# Patient Record
Sex: Female | Born: 1945
Health system: Southern US, Community
[De-identification: ages and names within clinical notes are randomized; demographics above are authoritative.]

## PROBLEM LIST (undated history)

## (undated) DIAGNOSIS — M1990 Unspecified osteoarthritis, unspecified site: Secondary | ICD-10-CM

## (undated) DIAGNOSIS — I10 Essential (primary) hypertension: Secondary | ICD-10-CM

## (undated) DIAGNOSIS — R011 Cardiac murmur, unspecified: Secondary | ICD-10-CM

## (undated) DIAGNOSIS — E119 Type 2 diabetes mellitus without complications: Secondary | ICD-10-CM

## (undated) DIAGNOSIS — E1142 Type 2 diabetes mellitus with diabetic polyneuropathy: Secondary | ICD-10-CM

## (undated) HISTORY — PX: HERNIA REPAIR: SHX51

## (undated) HISTORY — PX: THORACOTOMY: SUR1349

## (undated) HISTORY — PX: ABDOMINAL HYSTERECTOMY: SHX81

## (undated) HISTORY — PX: CHOLECYSTECTOMY: SHX55

---

## 2004-09-25 ENCOUNTER — Ambulatory Visit: Payer: Self-pay | Admitting: Family Medicine

## 2005-07-18 ENCOUNTER — Ambulatory Visit: Payer: Self-pay | Admitting: Cardiology

## 2005-09-10 ENCOUNTER — Ambulatory Visit: Payer: Self-pay | Admitting: Unknown Physician Specialty

## 2005-10-03 ENCOUNTER — Ambulatory Visit: Payer: Self-pay | Admitting: Family Medicine

## 2006-11-02 ENCOUNTER — Emergency Department: Payer: Self-pay

## 2009-09-28 ENCOUNTER — Ambulatory Visit: Payer: Self-pay | Admitting: Unknown Physician Specialty

## 2011-03-01 ENCOUNTER — Ambulatory Visit: Payer: Self-pay | Admitting: Unknown Physician Specialty

## 2015-04-27 ENCOUNTER — Other Ambulatory Visit: Payer: Self-pay | Admitting: Podiatry

## 2015-04-27 DIAGNOSIS — M25572 Pain in left ankle and joints of left foot: Secondary | ICD-10-CM

## 2015-05-02 ENCOUNTER — Ambulatory Visit
Admission: RE | Admit: 2015-05-02 | Discharge: 2015-05-02 | Disposition: A | Discharge status: Home / Self Care | Payer: Medicare Other | Source: Ambulatory Visit | Attending: Podiatry | Admitting: Podiatry

## 2015-05-02 DIAGNOSIS — D2122 Benign neoplasm of connective and other soft tissue of left lower limb, including hip: Secondary | ICD-10-CM | POA: Diagnosis not present

## 2015-05-02 DIAGNOSIS — M25572 Pain in left ankle and joints of left foot: Secondary | ICD-10-CM

## 2015-05-02 MED ORDER — GADOBENATE DIMEGLUMINE 529 MG/ML IV SOLN
20.0000 mL | Freq: Once | INTRAVENOUS | Status: AC | PRN
  Administered 2015-05-02: 19 mL via INTRAVENOUS

## 2015-05-17 ENCOUNTER — Encounter
Admission: RE | Admit: 2015-05-17 | Discharge: 2015-05-17 | Disposition: A | Discharge status: Home / Self Care | Payer: Medicare Other | Source: Ambulatory Visit | Attending: Podiatry | Admitting: Podiatry

## 2015-05-17 DIAGNOSIS — I10 Essential (primary) hypertension: Secondary | ICD-10-CM | POA: Diagnosis not present

## 2015-05-17 DIAGNOSIS — D2122 Benign neoplasm of connective and other soft tissue of left lower limb, including hip: Secondary | ICD-10-CM | POA: Diagnosis present

## 2015-05-17 DIAGNOSIS — E119 Type 2 diabetes mellitus without complications: Secondary | ICD-10-CM | POA: Diagnosis not present

## 2015-05-17 HISTORY — DX: Type 2 diabetes mellitus without complications: E11.9

## 2015-05-17 HISTORY — DX: Essential (primary) hypertension: I10

## 2015-05-17 LAB — BASIC METABOLIC PANEL
Anion gap: 7 (ref 5–15)
BUN: 20 mg/dL (ref 6–20)
CALCIUM: 9.3 mg/dL (ref 8.9–10.3)
CO2: 26 mmol/L (ref 22–32)
CREATININE: 0.72 mg/dL (ref 0.44–1.00)
Chloride: 108 mmol/L (ref 101–111)
GFR calc Af Amer: >60 mL/min (ref >60)
GLUCOSE: 148 mg/dL — AB (ref 65–99)
Potassium: 4.2 mmol/L (ref 3.5–5.1)
Sodium: 141 mmol/L (ref 135–145)

## 2015-05-17 LAB — CBC
HEMATOCRIT: 40 % (ref 35.0–47.0)
Hemoglobin: 12.9 g/dL (ref 12.0–16.0)
MCH: 27.3 pg (ref 26.0–34.0)
MCHC: 32.1 g/dL (ref 32.0–36.0)
MCV: 84.9 fL (ref 80.0–100.0)
PLATELETS: 308 10*3/uL (ref 150–440)
RBC: 4.72 MIL/uL (ref 3.80–5.20)
RDW: 15.4 % — AB (ref 11.5–14.5)
WBC: 12.8 10*3/uL — AB (ref 3.6–11.0)

## 2015-05-17 LAB — DIFFERENTIAL
BASOS PCT: 1 %
Basophils Absolute: 0.1 10*3/uL (ref 0–0.1)
EOS ABS: 0.1 10*3/uL (ref 0–0.7)
Eosinophils Relative: 1 %
Lymphocytes Relative: 18 %
Lymphs Abs: 2.3 10*3/uL (ref 1.0–3.6)
MONO ABS: 0.7 10*3/uL (ref 0.2–0.9)
MONOS PCT: 6 %
NEUTROS ABS: 9.5 10*3/uL — AB (ref 1.4–6.5)
Neutrophils Relative %: 74 %

## 2015-05-17 NOTE — Patient Instructions (Addendum)
  Your procedure is scheduled on: May 20, 2015 (Friday) Report to Day Surgery.[facility]) Second Floor To find out your arrival time please call [phone] between 1PM - 3PM on May 19, 2015 (Thursday).  Remember: Instructions that are not followed completely may result in serious medical risk, up to and including death, or upon the discretion of your surgeon and anesthesiologist your surgery may need to be rescheduled.    __x__ 1. Do not eat food or drink liquids after midnight. No gum chewing or hard candies.     ____ 2. No Alcohol for 24 hours before or after surgery.   ____ 3. Bring all medications with you on the day of surgery if instructed.    __x__ 4. Notify your doctor if there is any change in your medical condition     (cold, fever, infections).     Do not wear jewelry, make-up, hairpins, clips or nail polish.  Do not wear lotions, powders, or perfumes. You may wear deodorant.  Do not shave 48 hours prior to surgery. Men may shave face and neck.  Do not bring valuables to the hospital.    [facility] is not responsible for any belongings or valuables.               Contacts, dentures or bridgework may not be worn into surgery.  Leave your suitcase in the car. After surgery it may be brought to your room.  For patients admitted to the hospital, discharge time is determined by your                treatment team.   Patients discharged the day of surgery will not be allowed to drive home.   Please read over the following fact sheets that you were given:   Surgical Site Infection Prevention   __x__ Take these medicines the morning of surgery with A SIP OF WATER:    1. Metoprolol  2. Amlodipine-Benazepril     ____ Fleet Enema (as directed)   __x__ Use CHG Soap as directed  ____ Use inhalers on the day of surgery  __x__ Stop metformin 2 days prior to surgery (STOP METFORMIN ON December 7 )    ____ Take 1/2 of usual insulin dose the night before surgery  and none on the morning of surgery.   __x__ Stop Coumadin/Plavix/aspirin on (STOP ASPIRIN NOW)  _x_ Stop Anti-inflammatories on (TYLENOL OK TO TAKE FOR PAIN IF NEEDED)   ____ Stop supplements until after surgery.    ____ Bring C-Pap to the hospital.

## 2015-05-20 ENCOUNTER — Encounter: Payer: Self-pay | Admitting: *Deleted

## 2015-05-20 ENCOUNTER — Ambulatory Visit: Payer: Medicare Other | Admitting: Anesthesiology

## 2015-05-20 ENCOUNTER — Encounter
Admission: RE | Disposition: A | Discharge status: Home / Self Care | Payer: Self-pay | Source: Ambulatory Visit | Attending: Podiatry

## 2015-05-20 ENCOUNTER — Ambulatory Visit
Admission: RE | Admit: 2015-05-20 | Discharge: 2015-05-20 | Disposition: A | Discharge status: Home / Self Care | Payer: Medicare Other | Source: Ambulatory Visit | Attending: Podiatry | Admitting: Podiatry

## 2015-05-20 DIAGNOSIS — E119 Type 2 diabetes mellitus without complications: Secondary | ICD-10-CM | POA: Insufficient documentation

## 2015-05-20 DIAGNOSIS — D2122 Benign neoplasm of connective and other soft tissue of left lower limb, including hip: Secondary | ICD-10-CM | POA: Insufficient documentation

## 2015-05-20 DIAGNOSIS — I10 Essential (primary) hypertension: Secondary | ICD-10-CM | POA: Insufficient documentation

## 2015-05-20 HISTORY — PX: MASS EXCISION: SHX2000

## 2015-05-20 LAB — GLUCOSE, CAPILLARY
GLUCOSE-CAPILLARY: 158 mg/dL — AB (ref 65–99)
Glucose-Capillary: 177 mg/dL — ABNORMAL HIGH (ref 65–99)

## 2015-05-20 SURGERY — EXCISION MASS
Anesthesia: General | Laterality: Left

## 2015-05-20 MED ORDER — PROPOFOL 10 MG/ML IV BOLUS
INTRAVENOUS | Status: DC | PRN
  Administered 2015-05-20: 30 mg via INTRAVENOUS

## 2015-05-20 MED ORDER — MIDAZOLAM HCL 2 MG/2ML IJ SOLN
INTRAMUSCULAR | Status: DC | PRN
  Administered 2015-05-20: 2 mg via INTRAVENOUS

## 2015-05-20 MED ORDER — FENTANYL CITRATE (PF) 100 MCG/2ML IJ SOLN: 25.0000 ug | INTRAMUSCULAR | Status: DC | PRN

## 2015-05-20 MED ORDER — OXYCODONE-ACETAMINOPHEN 5-325 MG PO TABS: 1.0000 | ORAL_TABLET | ORAL | Status: DC | PRN

## 2015-05-20 MED ORDER — FENTANYL CITRATE (PF) 100 MCG/2ML IJ SOLN
INTRAMUSCULAR | Status: DC | PRN
  Administered 2015-05-20: 25 ug via INTRAVENOUS

## 2015-05-20 MED ORDER — SODIUM CHLORIDE 0.9 % IV SOLN
INTRAVENOUS | Status: DC
  Administered 2015-05-20: 10:00:00 via INTRAVENOUS

## 2015-05-20 MED ORDER — ONDANSETRON HCL 4 MG/2ML IJ SOLN: 4.0000 mg | Freq: Once | INTRAMUSCULAR | Status: DC | PRN

## 2015-05-20 MED ORDER — BUPIVACAINE HCL 0.5 % IJ SOLN
INTRAMUSCULAR | Status: DC | PRN
  Administered 2015-05-20: 17 mL

## 2015-05-20 MED ORDER — BUPIVACAINE HCL (PF) 0.5 % IJ SOLN
INTRAMUSCULAR | Status: AC
  Filled 2015-05-20: qty 30

## 2015-05-20 MED ORDER — ONDANSETRON HCL 4 MG/2ML IJ SOLN
INTRAMUSCULAR | Status: DC | PRN
  Administered 2015-05-20: 4 mg via INTRAVENOUS

## 2015-05-20 MED ORDER — LIDOCAINE HCL (CARDIAC) 20 MG/ML IV SOLN
INTRAVENOUS | Status: DC | PRN
  Administered 2015-05-20: 60 mg via INTRAVENOUS

## 2015-05-20 MED ORDER — FAMOTIDINE 20 MG PO TABS
ORAL_TABLET | ORAL | Status: AC
  Administered 2015-05-20: 20 mg
  Filled 2015-05-20: qty 1

## 2015-05-20 MED ORDER — PROPOFOL 500 MG/50ML IV EMUL
INTRAVENOUS | Status: DC | PRN
  Administered 2015-05-20: 50 ug/kg/min via INTRAVENOUS

## 2015-05-20 SURGICAL SUPPLY — 32 items
BAG COUNTER SPONGE EZ (MISCELLANEOUS) IMPLANT
BANDAGE ELASTIC 4 LF NS (GAUZE/BANDAGES/DRESSINGS) ×3 IMPLANT
BANDAGE STRETCH 3X4.1 STRL (GAUZE/BANDAGES/DRESSINGS) IMPLANT
BNDG ESMARK 4X12 TAN STRL LF (GAUZE/BANDAGES/DRESSINGS) ×3 IMPLANT
BNDG GAUZE 4.5X4.1 6PLY STRL (MISCELLANEOUS) ×3 IMPLANT
CANISTER SUCT 1200ML W/VALVE (MISCELLANEOUS) ×3 IMPLANT
CLOSURE WOUND 1/4X4 (GAUZE/BANDAGES/DRESSINGS) ×1
COUNTER SPONGE BAG EZ (MISCELLANEOUS)
CUFF TOURN 18 STER (MISCELLANEOUS) ×3 IMPLANT
CUFF TOURN DUAL PL 12 NO SLV (MISCELLANEOUS) IMPLANT
DURAPREP 26ML APPLICATOR (WOUND CARE) ×3 IMPLANT
GAUZE PETRO XEROFOAM 1X8 (MISCELLANEOUS) ×3 IMPLANT
GAUZE SPONGE 4X4 12PLY STRL (GAUZE/BANDAGES/DRESSINGS) ×3 IMPLANT
GLOVE BIO SURGEON STRL SZ7.5 (GLOVE) ×9 IMPLANT
GLOVE INDICATOR 8.0 STRL GRN (GLOVE) ×9 IMPLANT
GOWN STRL REUS W/ TWL LRG LVL3 (GOWN DISPOSABLE) ×3 IMPLANT
GOWN STRL REUS W/TWL LRG LVL3 (GOWN DISPOSABLE) ×6
LABEL OR SOLS (LABEL) IMPLANT
NEEDLE HYPO 25X1 1.5 SAFETY (NEEDLE) ×3 IMPLANT
NS IRRIG 500ML POUR BTL (IV SOLUTION) ×3 IMPLANT
PACK EXTREMITY ARMC (MISCELLANEOUS) ×3 IMPLANT
PAD GROUND ADULT SPLIT (MISCELLANEOUS) ×3 IMPLANT
PENCIL ELECTRO HAND CTR (MISCELLANEOUS) IMPLANT
SOL PREP PVP 2OZ (MISCELLANEOUS) ×3
SOLUTION PREP PVP 2OZ (MISCELLANEOUS) ×1 IMPLANT
STOCKINETTE STRL 6IN 960660 (GAUZE/BANDAGES/DRESSINGS) ×3 IMPLANT
STRAP SAFETY BODY (MISCELLANEOUS) ×3 IMPLANT
STRIP CLOSURE SKIN 1/4X4 (GAUZE/BANDAGES/DRESSINGS) ×2 IMPLANT
SUT ETHILON 5-0 FS-2 18 BLK (SUTURE) IMPLANT
SUT VIC AB 4-0 FS2 27 (SUTURE) ×3 IMPLANT
SWABSTK COMLB BENZOIN TINCTURE (MISCELLANEOUS) ×3 IMPLANT
SYRINGE 10CC LL (SYRINGE) ×3 IMPLANT

## 2015-05-20 NOTE — Interval H&P Note (Signed)
History and Physical Interval Note:  05/20/2015 9:53 AM  Hannah Rangel  has presented today for surgery, with the diagnosis of TUMOR OF CONNECTIVE TISSUE LEFT ANKLE, LEFT ANKLE PAIN  The various methods of treatment have been discussed with the patient and family. After consideration of risks, benefits and other options for treatment, the patient has consented to  Procedure(s): EXCISION MASS (Left) as a surgical intervention .  The patient's history has been reviewed, patient examined, no change in status, stable for surgery.  I have reviewed the patient's chart and labs.  Questions were answered to the patient's satisfaction.     [name]

## 2015-05-20 NOTE — H&P (Signed)
  History and physical in the chart was reviewed. No interval changes. Patient stable for surgery 

## 2015-05-20 NOTE — Anesthesia Preprocedure Evaluation (Signed)
Anesthesia Evaluation  Patient identified by MRN, date of birth, ID band Patient awake    Reviewed: Allergy & Precautions, NPO status , Patient's Chart, lab work & pertinent test results  History of Anesthesia Complications Negative for: history of anesthetic complications  Airway Mallampati: III       Dental  (+) Teeth Intact   Pulmonary neg pulmonary ROS,           Cardiovascular hypertension, Pt. on medications and Pt. on home beta blockers negative cardio ROS       Neuro/Psych negative neurological ROS     GI/Hepatic negative GI ROS, Neg liver ROS,   Endo/Other  diabetes, Type 2, Oral Hypoglycemic Agents  Renal/GU negative Renal ROS     Musculoskeletal   Abdominal   Peds  Hematology negative hematology ROS (+)   Anesthesia Other Findings   Reproductive/Obstetrics                             Anesthesia Physical Anesthesia Plan  ASA: III  Anesthesia Plan: General   Post-op Pain Management:    Induction: Intravenous  Airway Management Planned: Nasal Cannula and Mask  Additional Equipment:   Intra-op Plan:   Post-operative Plan:   Informed Consent: I have reviewed the patients History and Physical, chart, labs and discussed the procedure including the risks, benefits and alternatives for the proposed anesthesia with the patient or authorized representative who has indicated his/her understanding and acceptance.     Plan Discussed with:   Anesthesia Plan Comments:         Anesthesia Quick Evaluation

## 2015-05-20 NOTE — Op Note (Signed)
Date of operation: 05/20/2015.  Surgeon: Durward Fortes DPM.  Preoperative diagnosis: Soft tissue tumor left rear foot.  Postoperative diagnosis: Same, pending pathology.  Procedure: Excision soft tissue tumor left rear foot.  Anesthesia: Local Mac.  Hemostasis: Pneumatic tourniquet left calf 250 mmHg.  Estimated blood loss: 5 cc.  Pathology: Soft tissue tumor from the left rear foot.  Complications: None apparent.   Operative indications: The patient has a history of a painful mass on the side of her left foot that is been present for months. Becoming more painful in her shoes. She did have an MRI of the mass taken for preoperative evaluation. 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 Decision was made for removal of the mass and pathology.  Operative procedure: Patient was taken to the operating room and placed on the table in the supine position. Following satisfactory sedation the left foot was anesthetized with 10 cc of 0.5% Sensorcaine plain around the lesion. A pneumatic tourniquet was applied at the level of the left calf and the foot was prepped and draped in the usual sterile fashion. The left foot and ankle were exsanguinated and the tourniquet inflated to 250 mmHg. Attention was then directed to the medial aspect of the left rear foot where an approximate 4 cm curvilinear incision was made coursing proximal to distal centered over the lesion. The incision was deepened via sharp and blunt dissection down to the level of the mass which was freed from the normal surrounding anatomy. It was noted to be multilobulated in appearance. The tumor was freed up from the normal surrounding tissues and removed in toto. Wound was flushed with copious amounts of sterile saline and then closed using 4-0 Vicryl running suture for all layers from deep and superficial subcutaneous to skin closure. Tincture benzoin and Steri-Strips applied followed by Xeroform  and a sterile bandage followed by Kerlix and an Ace wrap. Tourniquet was released and blood flow noted to return to medially to the left foot. Patient tolerated procedure and anesthesia well and was transported to the PACU with vital signs stable and in good condition.

## 2015-05-20 NOTE — Anesthesia Postprocedure Evaluation (Signed)
Anesthesia Post Note  Patient: Hannah Rangel  Procedure(s) Performed: Procedure(s) (LRB): excision connective tissue tumor left ankle (Left)  Patient location during evaluation: PACU Anesthesia Type: General Level of consciousness: awake and alert Pain management: pain level controlled Vital Signs Assessment: post-procedure vital signs reviewed and stable Respiratory status: spontaneous breathing and respiratory function stable Cardiovascular status: blood pressure returned to baseline and stable Anesthetic complications: no    Last Vitals:  Filed Vitals:   05/20/15 1203 05/20/15 1210  BP: 151/78 146/55  Pulse: 60 66  Temp: 37.4 C 36.9 C  Resp: 18 16    Last Pain:  Filed Vitals:   05/20/15 1225  PainSc: 0-No pain                 Kimbery Harwood K

## 2015-05-20 NOTE — Transfer of Care (Signed)
Immediate Anesthesia Transfer of Care Note  Patient: Hannah Rangel  Procedure(s) Performed: Procedure(s): excision connective tissue tumor left ankle (Left)  Patient Location: PACU  Anesthesia Type:General  Level of Consciousness: awake  Airway & Oxygen Therapy: Patient Spontanous Breathing and Patient connected to face mask oxygen  Post-op Assessment: Report given to RN  Post vital signs: Reviewed  Last Vitals:  Filed Vitals:   05/20/15 0816 05/20/15 1119  BP: 158/67 112/69  Pulse: 66 61  Temp: 36.7 C 37.3 C  Resp: 16 16    Complications: No apparent anesthesia complications

## 2015-05-20 NOTE — Discharge Instructions (Addendum)
1. Elevate left lower extremity on 2 pillows.  2. Keep the bandage on the left foot clean, dry, and do not remove..  3. Sponge bathe only left lower extremity.  4. Wear surgical shoe on the left foot whenever walking or standing.  5. Take one pain pill, Percocet, every 4 hours as needed for pain.

## 2015-05-23 LAB — SURGICAL PATHOLOGY

## 2015-06-16 ENCOUNTER — Other Ambulatory Visit: Payer: Self-pay | Admitting: Physician Assistant

## 2015-06-16 DIAGNOSIS — Z1231 Encounter for screening mammogram for malignant neoplasm of breast: Secondary | ICD-10-CM

## 2015-06-28 ENCOUNTER — Ambulatory Visit: Payer: Medicare Other | Attending: Physician Assistant

## 2016-10-31 IMAGING — MR MR ANKLE*L* WO/W CM
8 series · 37 of 40 positions shown · IV contrast (multihance)
Comparison: None.

CLINICAL DATA: Medial ankle mass, painful to touch.

EXAM:
MRI OF THE LEFT ANKLE WITHOUT AND WITH CONTRAST
TECHNIQUE: Multiplanar, multisequence MR imaging of the ankle was performed
before and after the administration of intravenous contrast.
CONTRAST:  19mL MULTIHANCE GADOBENATE DIMEGLUMINE 529 MG/ML IV SOLN

[Series 6: T1 · axial · 3.0mm · 0.78mm/px · z∈[-45,+83]mm · 7 of 40 slices shown (1 of 2)]
[im 1/40]
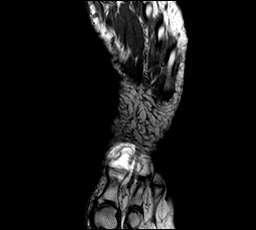
[im 7/40]
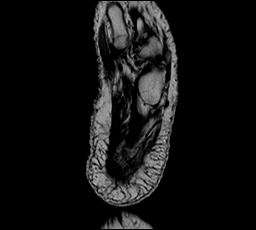
[im 14/40]
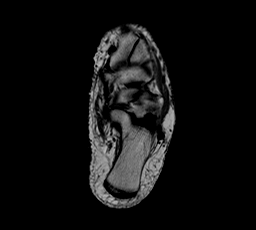
[im 20/40]
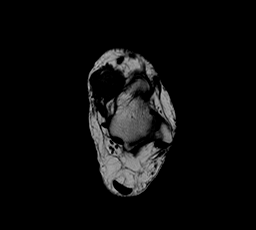
[im 27/40]
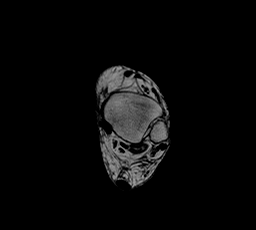
[im 33/40]
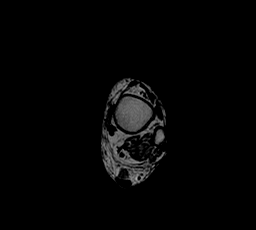
[im 40/40]
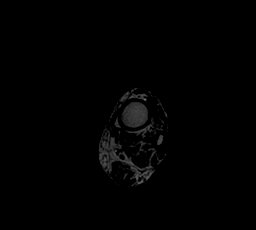

[Series 8: T2 fat-sat · axial · 3.0mm · 0.39mm/px · z∈[-45,+83]mm · 6 of 40 slices shown (1 of 3)]
[im 1/40]
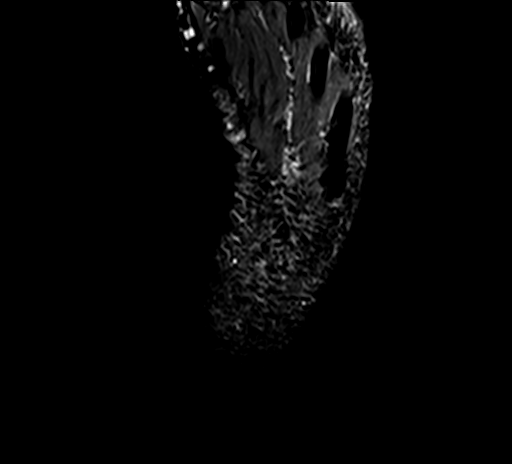
[im 8/40]
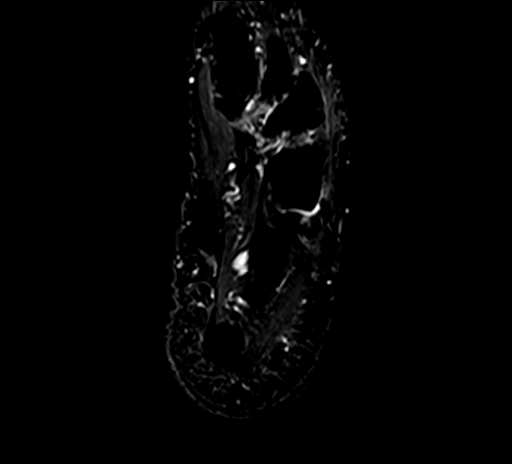
[im 16/40]
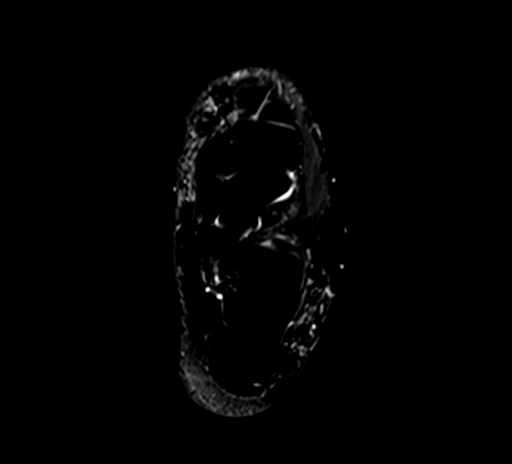
[im 24/40]
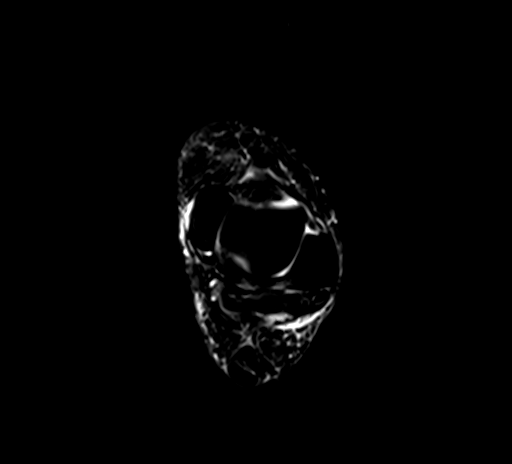
[im 32/40]
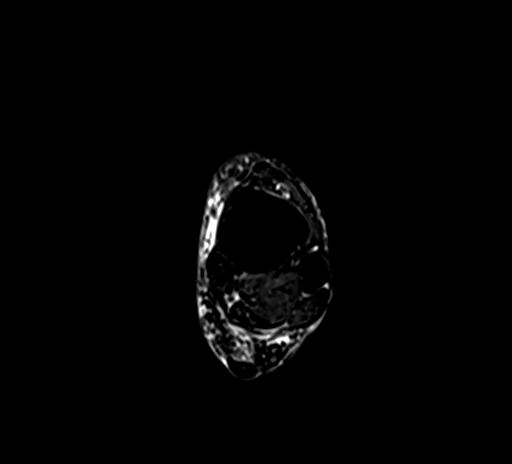
[im 40/40]
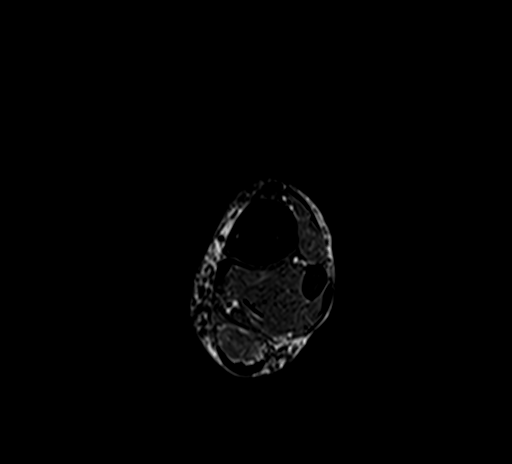

[Series 9: T1 fat-sat · coronal · 4.0mm · 0.70mm/px · 4 of 26 slices shown]
[im 1/26]
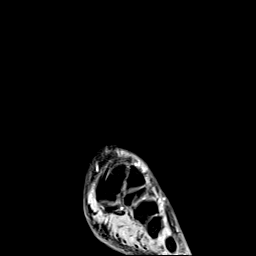
[im 9/26]
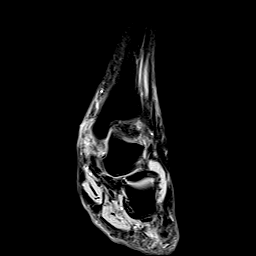
[im 17/26]
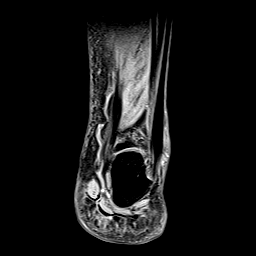
[im 26/26]
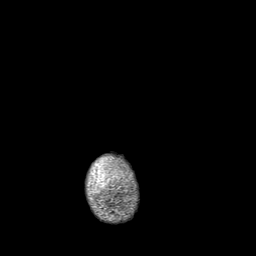

[Series 11: T2 fat-sat · sagittal · 4.0mm · 0.35mm/px · 3 of 19 slices shown (2 of 3)]
[im 1/19]
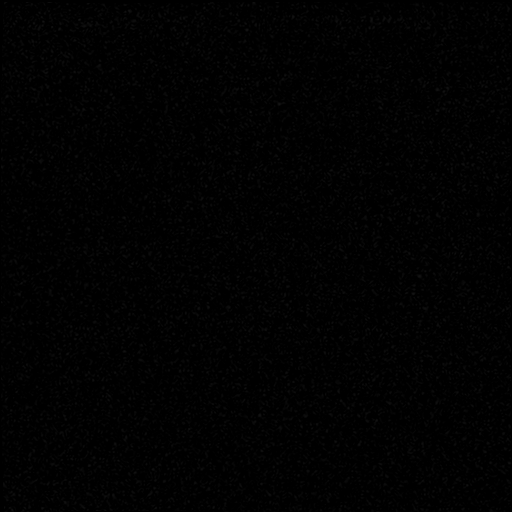
[im 10/19]
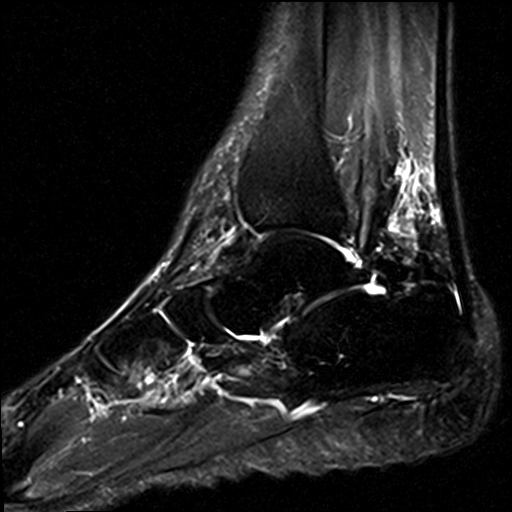
[im 19/19]
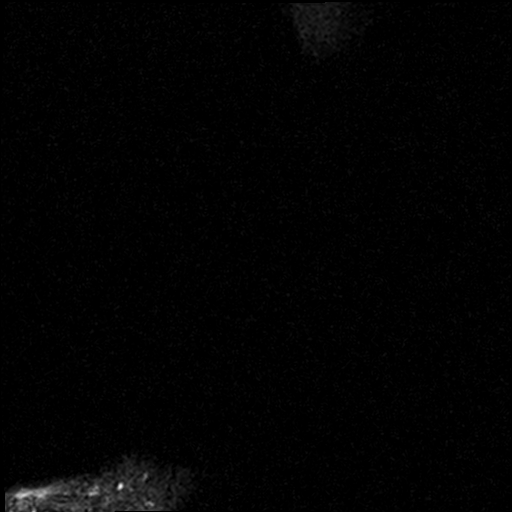

[Series 12: T2 fat-sat · coronal · 4.0mm · 0.35mm/px · 4 of 26 slices shown (3 of 3)]
[im 1/26]
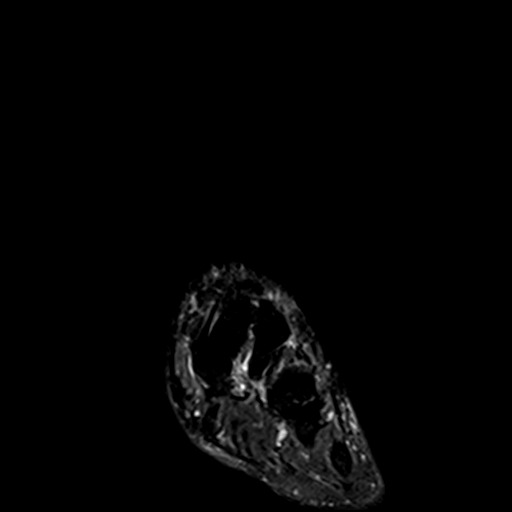
[im 9/26]
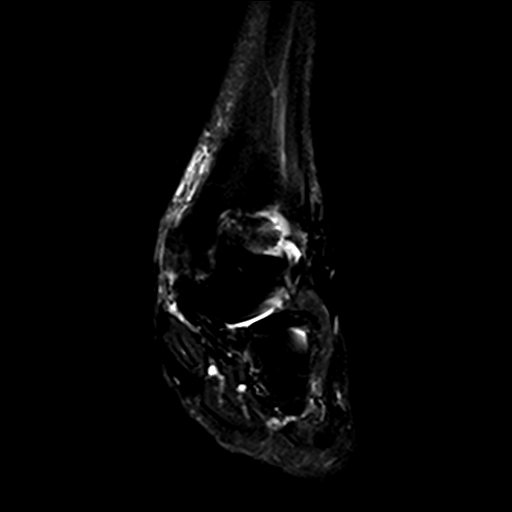
[im 17/26]
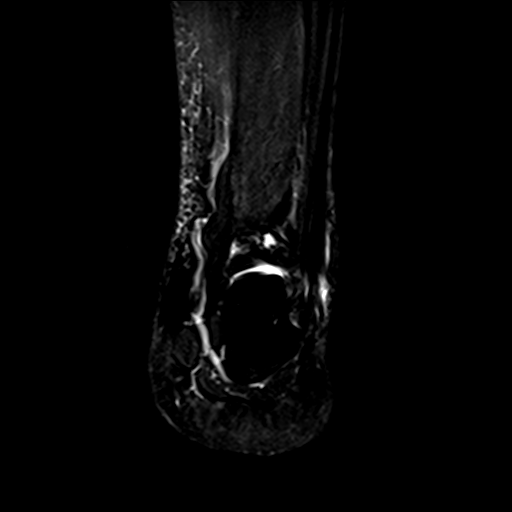
[im 26/26]
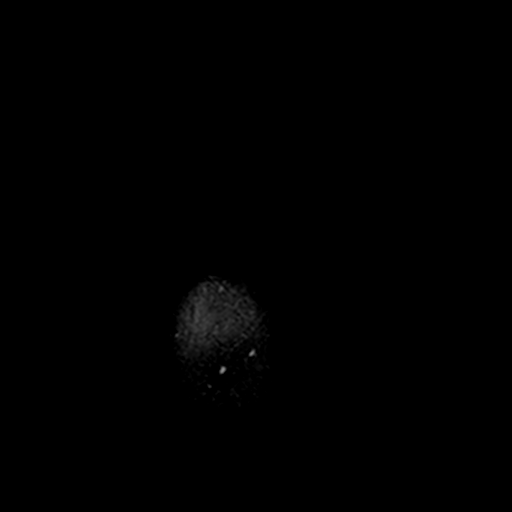

[Series 13: T1 fat-sat post-contrast · axial · 3.0mm · 0.78mm/px · z∈[-45,+83]mm · 6 of 40 slices shown (1 of 2)]
[im 1/40]
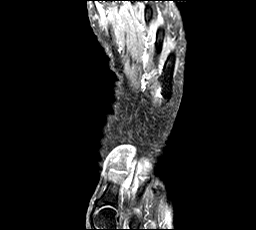
[im 8/40]
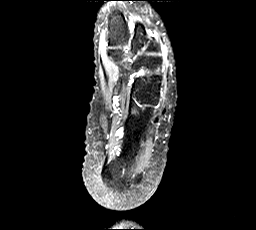
[im 16/40]
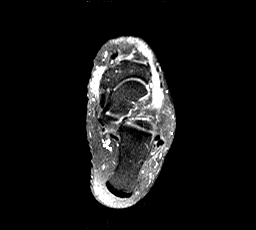
[im 24/40]
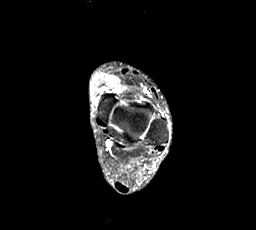
[im 32/40]
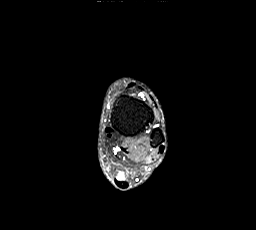
[im 40/40]
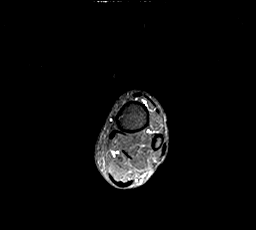

[Series 14: T1 fat-sat post-contrast · coronal · 4.0mm · 0.70mm/px · 4 of 26 slices shown (2 of 2)]
[im 1/26]
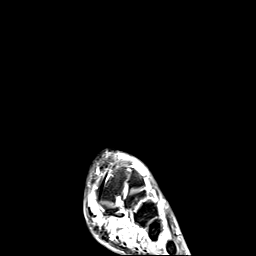
[im 9/26]
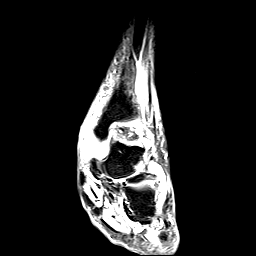
[im 17/26]
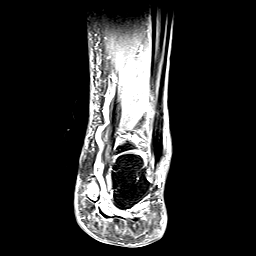
[im 26/26]
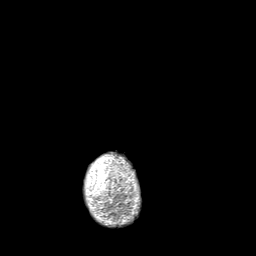

[Series 100: T1 · axial · 3.0mm · 0.78mm/px · z∈[-45,+4]mm · 3 of 40 slices shown (2 of 2)]
[im 1/40]
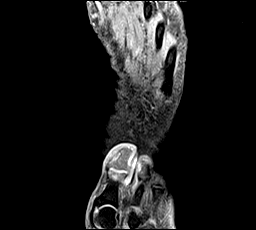
[im 8/40]
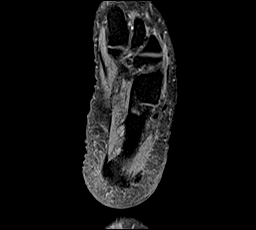
[im 16/40]
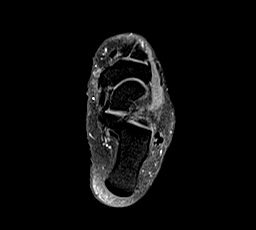

[37 of 40 positions shown; findings below may reference images not displayed]

FINDINGS: There is a 3.8 x 3.1 x 2.6 cm inhomogeneous irregularly enhancing
solid soft tissue mass immediately adjacent to the anterior aspect
of the medial malleolus. The tumor does not extend into the bone and
does not appear to extend into the joint. It is immediately anterior
to the posterior tibialis tendon and superficial to the deltoid
ligament. The adjacent tendons and muscles are normal.

TENDONS

Peroneal: Normal.

Posteromedial: Normal.

Anterior: Normal.

Achilles: Normal.

Plantar Fascia: Normal.

LIGAMENTS

Lateral: Normal.

Medial: Normal.

CARTILAGE

Ankle Joint: Normal.

Subtalar Joints/Sinus Tarsi: Normal.

Bones: Normal.
IMPRESSION: IMPRESSION
Soft tissue tumor anterior to the tip of the medial malleolus
consistent with a giant cell tumor of the tendon sheath. The fact
that the tumor does not involve any of the adjacent tendons is not
atypical.

## 2017-04-02 ENCOUNTER — Emergency Department
Admission: EM | Admit: 2017-04-02 | Discharge: 2017-04-02 | Disposition: A | Discharge status: Home / Self Care | Payer: Medicare Other | Attending: Emergency Medicine | Admitting: Emergency Medicine

## 2017-04-02 ENCOUNTER — Emergency Department: Payer: Medicare Other

## 2017-04-02 ENCOUNTER — Encounter: Payer: Self-pay | Admitting: Emergency Medicine

## 2017-04-02 DIAGNOSIS — I10 Essential (primary) hypertension: Secondary | ICD-10-CM | POA: Insufficient documentation

## 2017-04-02 DIAGNOSIS — Z79899 Other long term (current) drug therapy: Secondary | ICD-10-CM | POA: Insufficient documentation

## 2017-04-02 DIAGNOSIS — Z7984 Long term (current) use of oral hypoglycemic drugs: Secondary | ICD-10-CM | POA: Insufficient documentation

## 2017-04-02 DIAGNOSIS — Z7982 Long term (current) use of aspirin: Secondary | ICD-10-CM | POA: Insufficient documentation

## 2017-04-02 DIAGNOSIS — M79641 Pain in right hand: Secondary | ICD-10-CM | POA: Diagnosis present

## 2017-04-02 DIAGNOSIS — E119 Type 2 diabetes mellitus without complications: Secondary | ICD-10-CM | POA: Diagnosis not present

## 2017-04-02 DIAGNOSIS — L03113 Cellulitis of right upper limb: Secondary | ICD-10-CM | POA: Insufficient documentation

## 2017-04-02 HISTORY — DX: Unspecified osteoarthritis, unspecified site: M19.90

## 2017-04-02 LAB — COMPREHENSIVE METABOLIC PANEL
ALBUMIN: 4.2 g/dL (ref 3.5–5.0)
ALT: 13 U/L — AB (ref 14–54)
AST: 17 U/L (ref 15–41)
Alkaline Phosphatase: 102 U/L (ref 38–126)
Anion gap: 10 (ref 5–15)
BILIRUBIN TOTAL: 0.3 mg/dL (ref 0.3–1.2)
BUN: 13 mg/dL (ref 6–20)
CHLORIDE: 98 mmol/L — AB (ref 101–111)
CO2: 29 mmol/L (ref 22–32)
CREATININE: 0.7 mg/dL (ref 0.44–1.00)
Calcium: 9.8 mg/dL (ref 8.9–10.3)
GFR calc Af Amer: >60 mL/min (ref >60)
GFR calc non Af Amer: >60 mL/min (ref >60)
GLUCOSE: 213 mg/dL — AB (ref 65–99)
Potassium: 4 mmol/L (ref 3.5–5.1)
Sodium: 137 mmol/L (ref 135–145)
Total Protein: 8.5 g/dL — ABNORMAL HIGH (ref 6.5–8.1)

## 2017-04-02 LAB — CBC WITH DIFFERENTIAL/PLATELET
Basophils Absolute: 0.1 10*3/uL (ref 0–0.1)
Basophils Relative: 1 %
EOS PCT: 0 %
Eosinophils Absolute: 0 10*3/uL (ref 0–0.7)
HCT: 43.4 % (ref 35.0–47.0)
Hemoglobin: 14.1 g/dL (ref 12.0–16.0)
LYMPHS ABS: 2.5 10*3/uL (ref 1.0–3.6)
Lymphocytes Relative: 16 %
MCH: 27.8 pg (ref 26.0–34.0)
MCHC: 32.5 g/dL (ref 32.0–36.0)
MCV: 85.5 fL (ref 80.0–100.0)
MONO ABS: 1.3 10*3/uL — AB (ref 0.2–0.9)
Monocytes Relative: 8 %
Neutro Abs: 11.5 10*3/uL — ABNORMAL HIGH (ref 1.4–6.5)
Neutrophils Relative %: 75 %
PLATELETS: 416 10*3/uL (ref 150–440)
RBC: 5.08 MIL/uL (ref 3.80–5.20)
RDW: 14.2 % (ref 11.5–14.5)
WBC: 15.4 10*3/uL — ABNORMAL HIGH (ref 3.6–11.0)

## 2017-04-02 LAB — LACTIC ACID, PLASMA: Lactic Acid, Venous: 1.1 mmol/L (ref 0.5–1.9)

## 2017-04-02 MED ORDER — CEPHALEXIN 500 MG PO CAPS
500.0000 mg | ORAL_CAPSULE | Freq: Once | ORAL | Status: AC
  Administered 2017-04-02: 500 mg via ORAL
  Filled 2017-04-02: qty 1

## 2017-04-02 MED ORDER — TRAMADOL HCL 50 MG PO TABS
100.0000 mg | ORAL_TABLET | Freq: Once | ORAL | Status: AC
  Administered 2017-04-02: 100 mg via ORAL
  Filled 2017-04-02: qty 2

## 2017-04-02 MED ORDER — TRAMADOL HCL 50 MG PO TABS: ORAL_TABLET | ORAL | 0 refills | Status: DC

## 2017-04-02 MED ORDER — CEPHALEXIN 500 MG PO CAPS: 500.0000 mg | ORAL_CAPSULE | Freq: Four times a day (QID) | ORAL | 0 refills | Status: DC

## 2017-04-02 MED ORDER — DOXYCYCLINE HYCLATE 100 MG PO TABS
100.0000 mg | ORAL_TABLET | Freq: Once | ORAL | Status: AC
Start: 2017-04-02 — End: 2017-04-02
  Administered 2017-04-02: 100 mg via ORAL
  Filled 2017-04-02: qty 1

## 2017-04-02 MED ORDER — DOXYCYCLINE HYCLATE 100 MG PO CAPS: ORAL_CAPSULE | ORAL | 0 refills | Status: DC

## 2017-04-02 NOTE — ED Provider Notes (Signed)
[facility] Emergency Department Provider Note  ____________________________________________   First MD Initiated Contact with Patient 04/02/17 1920     (approximate)  I have reviewed the triage vital signs and the nursing notes.   HISTORY  Chief Complaint Hand Pain    HPI Hannah Rangel is a 71 y.o. female with medical history as listed below who presents for evaluation of pain, swelling, and redness in the right wrist that radiates a little bit into her right hand.  She is not certain how long it is been going on for, but she thinks for 1-2 days it is been getting gradually worse.  Is now moderate to severe in terms of the pain and mild to moderate in terms of the swelling.  She has had no known injury.  She has a few scratches on her arm but nothing directly around the area of swelling and tenderness.  She has no animals at home, specifically nor dogs, and she does not know where the scratches came from although she thinks may be it is from a heating pad.  She is able to grip things with her hand although it does cause some pain in her hand and her wrist.  She denies any fever or chills, chest pain, shortness of breath, vomiting, and abdominal pain, although she has had a little bit of nausea.  Moving and using the wrist and hand make it worse, rest makes it a little bit better.  Past Medical History:  Diagnosis Date  . Arthritis   . Diabetes mellitus without complication ([place])   . Hypertension     There are no active problems to display for this patient.   Past Surgical History:  Procedure Laterality Date  . ABDOMINAL HYSTERECTOMY    . CHOLECYSTECTOMY    . HERNIA REPAIR     Ventral Hernia Repair  . MASS EXCISION Left 05/20/2015   Procedure: excision connective tissue tumor left ankle;  Surgeon: Sharlotte Alamo, MD;  Location: [facility] ORS;  Service: Podiatry;  Laterality: Left;  . THORACOTOMY      Prior to Admission medications   Medication Sig Start Date  End Date Taking? Authorizing Provider  amLODipine-benazepril (LOTREL) 10-20 MG capsule Take 1 capsule by mouth daily.    [provider]  aspirin EC 81 MG tablet Take 81 mg by mouth daily.    [provider]  atorvastatin (LIPITOR) 20 MG tablet Take 20 mg by mouth daily.    [provider]  cephALEXin (KEFLEX) 500 MG capsule Take 1 capsule (500 mg total) by mouth 4 (four) times daily. 04/02/17   Hinda Kehr, MD  doxycycline (VIBRAMYCIN) 100 MG capsule Take 1 capsule (100 mg) by mouth twice daily for 10 days. 04/02/17   Hinda Kehr, MD  glimepiride (AMARYL) 2 MG tablet Take 2 mg by mouth daily with breakfast.    [provider]  hydrochlorothiazide (HYDRODIURIL) 25 MG tablet Take 25 mg by mouth daily.    [provider]  metFORMIN (GLUCOPHAGE) 1000 MG tablet Take 1,000 mg by mouth 2 (two) times daily with a meal.    [provider]  metoprolol (LOPRESSOR) 50 MG tablet Take 50 mg by mouth 2 (two) times daily.    [provider]  oxyCODONE-acetaminophen (ROXICET) 5-325 MG tablet Take 1 tablet by mouth every 4 (four) hours as needed for severe pain. 05/20/15   Sharlotte Alamo, DPM  traMADol (ULTRAM) 50 MG tablet Take 1-2 tablets by mouth every 6 hours as needed  for moderate to severe pain 04/02/17   Hinda Kehr, MD    Allergies Patient has no known allergies.  No family history on file.  Social History Social History  Substance Use Topics  . Smoking status: Never Smoker  . Smokeless tobacco: Never Used  . Alcohol use No    Review of Systems Constitutional: No fever/chills Eyes: No visual changes. Cardiovascular: Denies chest pain. Respiratory: Denies shortness of breath. Gastrointestinal: No abdominal pain.  Nausea, no vomiting.  No diarrhea.  No constipation. Genitourinary: Negative for dysuria. Musculoskeletal: Pain and swelling primarily to the right wrist but radiating into the hand. Integumentary: redness of the right  wrist and slightly including the hand Neurological: Negative for headaches, focal weakness or numbness.   ____________________________________________   PHYSICAL EXAM:  VITAL SIGNS: ED Triage Vitals [04/02/17 1750]  Enc Vitals Group     BP (!) 206/73     Pulse Rate 99     Resp 17     Temp 98.8 F (37.1 C)     Temp Source Oral     SpO2 96 %     Weight 93.4 kg (206 lb)     Height 1.6 m (5\' 3" )     Head Circumference      Peak Flow      Pain Score 10     Pain Loc      Pain Edu?      Excl. in [place]?     Constitutional: Alert and oriented. Well appearing and in no acute distress. Eyes: Conjunctivae are normal.  Head: Atraumatic. Cardiovascular: borderline tachycardia , regular rhythm. Good peripheral circulation. Grossly normal heart sounds. Respiratory: Normal respiratory effort.  No retractions. Lungs CTAB. Gastrointestinal: Soft and nontender. No distention.  Musculoskeletal:   The patient has some mild edema and erythema around the dorsal right wrist, non-circumferential, that extends slightly into the proximal part of the back of the hand.  She has no tenderness to palpation of the snuffbox and no swelling of the fingers with normal flexion and extension although she says that it does create some pain in her hand and wrist.  There is no gross bony deformity.  There is tenderness to palpation throughout.  There is no redness or streaking that extends proximally up the arm from the wrist.  She has a few superficial scratches to the proximal forearm, separated by a considerable distance from the erythema and edema of the wrist.  There is no obvious break in the skin and no area of fluctuance nor induration.  She has no passive flexion of her fingers, no pain with passive extension of her fingers, no fusiform swelling, and no tenderness along the tendon sheaths Neurologic:  Normal speech and language. No gross focal neurologic deficits are appreciated.  Skin:  Skin is warm, dry and  intact.  See musculoskeletal for focal details Psychiatric: Mood and affect are normal. Speech and behavior are normal.  ____________________________________________   LABS (all labs ordered are listed, but only abnormal results are displayed)  Labs Reviewed  COMPREHENSIVE METABOLIC PANEL - Abnormal; Notable for the following:       Result Value   Chloride 98 (*)    Glucose, Bld 213 (*)    Total Protein 8.5 (*)    ALT 13 (*)    All other components within normal limits  CBC WITH DIFFERENTIAL/PLATELET - Abnormal; Notable for the following:    WBC 15.4 (*)    Neutro Abs 11.5 (*)    Monocytes Absolute  1.3 (*)    All other components within normal limits  LACTIC ACID, PLASMA   ____________________________________________  EKG  None - EKG not ordered by ED physician ____________________________________________  RADIOLOGY   Dg Wrist Complete Right  Result Date: 04/02/2017 CLINICAL DATA:  Pain swelling and no trauma EXAM: RIGHT WRIST - COMPLETE 3+ VIEW COMPARISON:  None. FINDINGS: There is no evidence of fracture or dislocation. There is no evidence of arthropathy or other focal bone abnormality. Soft tissue swelling is present. No soft tissue gas or radiopaque foreign body IMPRESSION: Soft tissue swelling.  No definite acute osseous abnormality. Electronically Signed   By: Donavan Foil M.D.   On: 04/02/2017 20:19    ____________________________________________   PROCEDURES  Critical Care performed: No   Procedure(s) performed:   Procedures   ____________________________________________   INITIAL IMPRESSION / ASSESSMENT AND PLAN / ED COURSE  As part of my medical decision making, I reviewed the following data within the [facility] notes reviewed and incorporated, Labs reviewed  and Radiograph reviewed     Differential diagnosis includes trauma/bony injury, infectious process including both cellulitis and deep tissue infection,  necrotizing fasciitis, carpal tunnel syndrome, muscle strain/sprain, septic joint, etc.  Labs are notable for a normal lactic acid, a metabolic panel only notable for hyperglycemia in the setting of known diabetes, but a leukocytosis of 15.4.  She is afebrile and borderline tachycardia resolved after she rested in her exam bed.  Based on my exam and the HPI, I believe that she most likely has cellulitis.  There is no evidence of tenosynovitis or necrotizing fasciitis.  Radiographs demonstrate some soft tissue swelling but no subcutaneous gas/gangrene and no bony deformities.  I believe she would benefit from broad antibiotic coverage for cellulitis including MRSA coverage and close outpatient follow-up, preferably with a hand specialist.  I will start her on Keflex and doxycycline as there is no clear indication that she specifically needs Cipro/Levaquin nor Augmentin.  I will give her tramadol for pain control and also encouraged the use of ibuprofen and Tylenol.  I gave her the name and number for outpatient follow-up with orthopedics/hand and gave her my usual and customary return precautions.  She understands and agrees with the plan.     ____________________________________________  FINAL CLINICAL IMPRESSION(S) / ED DIAGNOSES  Final diagnoses:  Cellulitis of right upper extremity     MEDICATIONS GIVEN DURING THIS VISIT:  Medications  doxycycline (VIBRA-TABS) tablet 100 mg (100 mg Oral Given 04/02/17 2048)  cephALEXin (KEFLEX) capsule 500 mg (500 mg Oral Given 04/02/17 2048)  traMADol (ULTRAM) tablet 100 mg (100 mg Oral Given 04/02/17 2048)     NEW OUTPATIENT MEDICATIONS STARTED DURING THIS VISIT:  Discharge Medication List as of 04/02/2017  8:42 PM    START taking these medications   Details  cephALEXin (KEFLEX) 500 MG capsule Take 1 capsule (500 mg total) by mouth 4 (four) times daily., Starting Tue 04/02/2017, Print    doxycycline (VIBRAMYCIN) 100 MG capsule Take 1 capsule (100  mg) by mouth twice daily for 10 days., Print    traMADol (ULTRAM) 50 MG tablet Take 1-2 tablets by mouth every 6 hours as needed for moderate to severe pain, Print        Discharge Medication List as of 04/02/2017  8:42 PM      Discharge Medication List as of 04/02/2017  8:42 PM       Note:  This document was prepared using Dragon voice recognition software  and may include unintentional dictation errors.    Hinda Kehr, MD 04/02/17 2255

## 2017-04-02 NOTE — ED Triage Notes (Signed)
Patient presents to ED via POV from home with c/o right hand swelling. Patients right hand is hot, red and swollen. Patient denies injury or trauma. Scratches noted to right arm. Patient states, "I think that's from my heating pad".

## 2017-04-02 NOTE — Discharge Instructions (Signed)
We believe the pain and swelling you have in your wrist the result of a mild infection called cellulitis.  This should be treatable with antibiotics.  We gave you a first dose of medication in the emergency department, but please fill your prescriptions in the morning and take the antibiotics (cephalexin and doxycycline) for the full course of treatment (10 days).  We also provided a prescription for tramadol which she can use for moderate to severe pain.  Try using over-the-counter ibuprofen and Tylenol to see if this controlled her pain, but you can use both of these along with tramadol if needed.  We recommend that you use the wrist splint as needed for comfort.  Tried to keep your arm elevated as much as possible on one or 2 pillows whenever you are sitting or lying down.  Please call the doctor listed in the paperwork to schedule the next available follow-up visit.  She is generally in the clinic on Thursday.  If she is unavailable, one of her partner should be able to see you in clinic for a follow-up visit within 2-3 days.  Return to the emergency department if you develop new or worsening symptoms that concern you.

## 2018-07-31 ENCOUNTER — Other Ambulatory Visit: Payer: Self-pay | Admitting: Physician Assistant

## 2018-07-31 DIAGNOSIS — Z1231 Encounter for screening mammogram for malignant neoplasm of breast: Secondary | ICD-10-CM

## 2018-11-17 NOTE — H&P (Signed)
Hannah Rangel is a 73 y.o. 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 She also has urinary incontinence. Patient has had an anterior repair/bladder tack in 1980s and desires a repeat. We discussed the possibility that vaginal mesh was used for repair at that time. I offered a referral to Urogyn for second opinion, as well as discussing apical repair, and she is not interested.  -Today she reports having vaginal spotting, which is coming from the vaginal mucosa that rubs her clothes, and OAB, which may be improved after surgery but will likely need an antimuscarinic; we will address this post-operatively if it is still a problem. -She is using vaginal estrogen with improvement; she will continue to use until surgery and for 1 month after.  Body mass index is 31.17 kg/m.  Surgical Hx: -Abdominal Hysterectomy -Bladder tack in 1980s - HTN, on beta blockers  Pt was seen with her sister today who has better eyesight, and her sister's questions were answered as well.  Past Medical History:  has a past medical history of Cataract, Degenerative arthritis, Diverticulosis of colon (without mention of hemorrhage), Hyperlipemia, Hypertension, Obesity, Osteoarthritis, and Type II or unspecified type diabetes mellitus without mention of complication, not stated as uncontrolled (CMS-HCC).  Past Surgical History:  has a past surgical history that includes Thoracotomy; Repair Ventral Hernia Laparoscopic; hysterectomy vaginal; Cholecystectomy; colonoscopy (09/10/2005); foot surgery (Left, 05/2015); Hysterectomy; Cholecystectomy; and flexible sigmoidoscopy (05/11/1998). Family History: family history includes Diabetes in her sister; Heart failure in her mother. Social History:  reports that she has never smoked. She has never used smokeless tobacco. She reports that she does  not drink alcohol or use drugs. OB/GYN History:  OB History    Gravida  0   Para  0   Term  0   Preterm  0   AB  0   Living  0     SAB  0   TAB  0   Ectopic  0   Molar  0   Multiple  0   Live Births  0        Allergies: has No Known Allergies. Medications:  Current Outpatient Medications:  .  amLODIPine-benazepril (LOTREL) 10-20 mg capsule, Take 1 capsule by mouth once daily for 180 days, Disp: 30 capsule, Rfl: 5 .  aspirin 81 MG EC tablet, Take 81 mg by mouth once daily., Disp: , Rfl:  .  atorvastatin (LIPITOR) 20 MG tablet, Take 1 tablet (20 mg total) by mouth once daily, Disp: 30 tablet, Rfl: 5 .  blood glucose diagnostic test strip, Use once daily Use as instructed., Disp: 100 each, Rfl: 11 .  dapagliflozin (FARXIGA) 10 mg tablet, Take 1 tablet (10 mg total) by mouth every morning, Disp: 30 tablet, Rfl: 11 .  estradioL (ESTRACE) 0.01 % (0.1 mg/gram) vaginal cream, Insert pea size amount every other night, Disp: 42.5 g, Rfl: 3 .  glimepiride (AMARYL) 2 MG tablet, Take 1 tablet (2 mg total) by mouth 2 (two) times daily, Disp: 60 tablet, Rfl: 5 .  hydroCHLOROthiazide (HYDRODIURIL) 25 MG tablet, TAKE 1 TABLET BY MOUTH EVERY DAY IN THE MORNING, Disp: 90 tablet, Rfl: 1 .  lancets, Use 1 each once daily Use as instructed., Disp: 100 each, Rfl: 3 .  metFORMIN (GLUCOPHAGE) 500 MG tablet, TAKE 2 TABLETS BY MOUTH TWICE A DAY, Disp: 60 tablet, Rfl: 5 .  metoprolol tartrate (LOPRESSOR) 50 MG tablet, Take 1.5 tablets (75 mg total) by mouth 2 (two) times daily, Disp: 90 tablet, Rfl: 5 .  potassium chloride (KLOR-CON M20) 20 MEQ ER tablet, Take 1 tablet (20 mEq total) by mouth 2 (two) times daily for 180 days, Disp: 60 tablet, Rfl: 5  Review of Systems: No SOB, no palpitations or chest pain, no new lower extremity edema, no nausea or vomiting or bowel complaints. See HPI for gyn specific ROS.   Exam:   BP 149/83   Pulse 91   Ht 170.2 cm (5\' 7" )   Wt 90.3 kg (199 lb)   BMI  31.17 kg/m   Constitutional:  General appearance: Well nourished, well developed female in no acute distress.  CV: RRR Pulm: CTAB Neuro/psych:  Normal mood and affect. No gross motor deficits. Neck:  Supple, normal appearance.  Respiratory:  Normal respiratory effort, no use of accessory muscles Skin:  No visible rashes or external lesions  Impression:   The primary encounter diagnosis was Pre-operative clearance. A diagnosis of Female bladder prolapse was also pertinent to this visit.  Plan:   1. Pre-Operative Visit  Patient returns for a preoperative discussion regarding her plans to proceed with surgical treatment of her Garde 3 Anterior Prolapse by Anterior Repair procedure.  The patient and I discussed the technical aspects of the procedure including the potential for risks and complications.  These include but are not limited to the risk of infection requiring post-operative antibiotics or further procedures.  We talked about the risk of injury to adjacent organs including bladder, bowel, ureter, blood vessels or nerves.  We talked about the need to convert to an open incision.  We talked about the possible need for blood transfusion.  We talked about postop complications such as thromboembolic or cardiopulmonary complications.  All of her questions were answered.   -The appropriate consents were signed.  -She is scheduled to undergo this procedure on 12/15/2018.  Specific Peri-operative Considerations:  - Consent: obtained today - Health Maintenance: up to date - Labs: CBC, CMP preoperatively - Studies: EKG, CXR preoperatively - Bowel Preparation: None required - Abx:  Ancef 2g - VTE ppx: SCDs perioperatively - Glucose Protocol: n/a (on metformin) - Beta-blockade: yes   Return for Postop check.  Sherrie George, MD

## 2018-12-11 ENCOUNTER — Other Ambulatory Visit: Payer: Self-pay

## 2018-12-11 ENCOUNTER — Encounter
Admission: RE | Admit: 2018-12-11 | Discharge: 2018-12-11 | Disposition: A | Discharge status: Home / Self Care | Payer: Medicare Other | Source: Ambulatory Visit | Attending: Obstetrics and Gynecology | Admitting: Obstetrics and Gynecology

## 2018-12-11 DIAGNOSIS — Z1159 Encounter for screening for other viral diseases: Secondary | ICD-10-CM | POA: Diagnosis not present

## 2018-12-11 DIAGNOSIS — Z01818 Encounter for other preprocedural examination: Secondary | ICD-10-CM | POA: Insufficient documentation

## 2018-12-11 DIAGNOSIS — I10 Essential (primary) hypertension: Secondary | ICD-10-CM | POA: Insufficient documentation

## 2018-12-11 HISTORY — DX: Cardiac murmur, unspecified: R01.1

## 2018-12-11 LAB — BASIC METABOLIC PANEL
Anion gap: 11 (ref 5–15)
BUN: 18 mg/dL (ref 8–23)
CO2: 23 mmol/L (ref 22–32)
Calcium: 9.1 mg/dL (ref 8.9–10.3)
Chloride: 108 mmol/L (ref 98–111)
Creatinine, Ser: 0.6 mg/dL (ref 0.44–1.00)
GFR calc Af Amer: >60 mL/min (ref >60)
GFR calc non Af Amer: >60 mL/min (ref >60)
Glucose, Bld: 156 mg/dL — ABNORMAL HIGH (ref 70–99)
Potassium: 3.6 mmol/L (ref 3.5–5.1)
Sodium: 142 mmol/L (ref 135–145)

## 2018-12-11 LAB — TYPE AND SCREEN
ABO/RH(D): O POS
Antibody Screen: NEGATIVE

## 2018-12-11 LAB — CBC
HCT: 40 % (ref 36.0–46.0)
Hemoglobin: 13 g/dL (ref 12.0–15.0)
MCH: 27.8 pg (ref 26.0–34.0)
MCHC: 32.5 g/dL (ref 30.0–36.0)
MCV: 85.7 fL (ref 80.0–100.0)
Platelets: 324 10*3/uL (ref 150–400)
RBC: 4.67 MIL/uL (ref 3.87–5.11)
RDW: 13.6 % (ref 11.5–15.5)
WBC: 8.4 10*3/uL (ref 4.0–10.5)
nRBC: 0 % (ref 0.0–0.2)

## 2018-12-11 LAB — SARS CORONAVIRUS 2 (TAT 6-24 HRS): SARS Coronavirus 2: NEGATIVE

## 2018-12-11 NOTE — Patient Instructions (Addendum)
Your procedure is scheduled on: 12/15/2018 Mon Report to Same Day Surgery 2nd floor medical mall [facility] Entrance-take elevator on left to 2nd floor.  Check in with surgery information desk.) To find out your arrival time please call [phone] between 1PM - 3PM on 12/12/2018 Fri  Remember: Instructions that are not followed completely may result in serious medical risk, up to and including death, or upon the discretion of your surgeon and anesthesiologist your surgery may need to be rescheduled.    _x___ 1. Do not eat food after midnight the night before your procedure. You may drink clear liquids up to 2 hours before you are scheduled to arrive at the hospital for your procedure.  Do not drink clear liquids within 2 hours of your scheduled arrival to the hospital.  Clear liquids include  --Water or Apple juice without pulp  --Clear carbohydrate beverage such as ClearFast or Gatorade  --Black Coffee or Clear Tea (No milk, no creamers, do not add anything to                  the coffee or Tea Type 1 and type 2 diabetics should only drink water.   ____Ensure clear carbohydrate drink on the way to the hospital for bariatric patients  __x__Ensure clear carbohydrate drink 3 hours before surgery for    No gum chewing or hard candies.     __x__ 2. No Alcohol for 24 hours before or after surgery.   __x__3. No Smoking or e-cigarettes for 24 prior to surgery.  Do not use any chewable tobacco products for at least 6 hour prior to surgery   ____  4. Bring all medications with you on the day of surgery if instructed.    __x__ 5. Notify your doctor if there is any change in your medical condition     (cold, fever, infections).    x___6. On the morning of surgery brush your teeth with toothpaste and water.  You may rinse your mouth with mouth wash if you wish.  Do not swallow any toothpaste or mouthwash.   Do not wear jewelry, make-up, hairpins, clips or nail polish.  Do not wear lotions,  powders, or perfumes. You may wear deodorant.  Do not shave 48 hours prior to surgery. Men may shave face and neck.  Do not bring valuables to the hospital.    [facility] is not responsible for any belongings or valuables.               Contacts, dentures or bridgework may not be worn into surgery.  Leave your suitcase in the car. After surgery it may be brought to your room.  For patients admitted to the hospital, discharge time is determined by your                       treatment team.  _  Patients discharged the day of surgery will not be allowed to drive home.  You will need someone to drive you home and stay with you the night of your procedure.    Please read over the following fact sheets that you were given:   [facility] Preparing for Surgery and or MRSA Information   _x___ Take anti-hypertensive listed below, cardiac, seizure, asthma,     anti-reflux and psychiatric medicines. These include:  1. atorvastatin (LIPITOR) 20 MG tablet  2.metoprolol (LOPRESSOR) 50 MG tablet  3.  4.  5.  6.  ____Fleets enema or Magnesium  Citrate as directed.   _x___ Use CHG Soap or sage wipes as directed on instruction sheet   ____ Use inhalers on the day of surgery and bring to hospital day of surgery  _x___ Stop Metformin and Janumet 2 days prior to surgery.    ____ Take 1/2 of usual insulin dose the night before surgery and none on the morning     surgery.   _x___ Follow recommendations from Cardiologist, Pulmonologist or PCP regarding          stopping Aspirin, Coumadin, Plavix ,Eliquis, Effient, or Pradaxa, and Pletal.  X____Stop Anti-inflammatories such as Advil, Aleve, Ibuprofen, Motrin, Naproxen, Naprosyn, Goodies powders or aspirin products. OK to take Tylenol and                          Celebrex.   _x___ Stop supplements until after surgery.  But may continue Vitamin D, Vitamin B,       and multivitamin.   ____ Bring C-Pap to the hospital.

## 2018-12-11 NOTE — Pre-Procedure Instructions (Signed)
Incentive Spirometry and G2 drink given with instructions.

## 2018-12-15 ENCOUNTER — Other Ambulatory Visit: Payer: Self-pay

## 2018-12-15 ENCOUNTER — Observation Stay: Payer: Medicare Other | Admitting: Anesthesiology

## 2018-12-15 ENCOUNTER — Encounter: Payer: Self-pay | Admitting: *Deleted

## 2018-12-15 ENCOUNTER — Observation Stay
Admission: RE | Admit: 2018-12-15 | Discharge: 2018-12-15 | Disposition: A | Discharge status: Home / Self Care | Payer: Medicare Other | Attending: Obstetrics and Gynecology | Admitting: Obstetrics and Gynecology

## 2018-12-15 ENCOUNTER — Encounter
Admission: RE | Disposition: A | Discharge status: Home / Self Care | Payer: Self-pay | Source: Home / Self Care | Attending: Obstetrics and Gynecology

## 2018-12-15 DIAGNOSIS — R32 Unspecified urinary incontinence: Secondary | ICD-10-CM | POA: Insufficient documentation

## 2018-12-15 DIAGNOSIS — Z9049 Acquired absence of other specified parts of digestive tract: Secondary | ICD-10-CM | POA: Diagnosis not present

## 2018-12-15 DIAGNOSIS — Z7982 Long term (current) use of aspirin: Secondary | ICD-10-CM | POA: Insufficient documentation

## 2018-12-15 DIAGNOSIS — K5790 Diverticulosis of intestine, part unspecified, without perforation or abscess without bleeding: Secondary | ICD-10-CM | POA: Insufficient documentation

## 2018-12-15 DIAGNOSIS — N8110 Cystocele, unspecified: Secondary | ICD-10-CM | POA: Diagnosis present

## 2018-12-15 DIAGNOSIS — E119 Type 2 diabetes mellitus without complications: Secondary | ICD-10-CM | POA: Insufficient documentation

## 2018-12-15 DIAGNOSIS — Z7984 Long term (current) use of oral hypoglycemic drugs: Secondary | ICD-10-CM | POA: Insufficient documentation

## 2018-12-15 DIAGNOSIS — Z79899 Other long term (current) drug therapy: Secondary | ICD-10-CM | POA: Insufficient documentation

## 2018-12-15 DIAGNOSIS — N813 Complete uterovaginal prolapse: Principal | ICD-10-CM | POA: Insufficient documentation

## 2018-12-15 DIAGNOSIS — Z6832 Body mass index (BMI) 32.0-32.9, adult: Secondary | ICD-10-CM | POA: Diagnosis not present

## 2018-12-15 DIAGNOSIS — Z90710 Acquired absence of both cervix and uterus: Secondary | ICD-10-CM | POA: Diagnosis not present

## 2018-12-15 DIAGNOSIS — E669 Obesity, unspecified: Secondary | ICD-10-CM | POA: Diagnosis not present

## 2018-12-15 DIAGNOSIS — Z833 Family history of diabetes mellitus: Secondary | ICD-10-CM | POA: Diagnosis not present

## 2018-12-15 DIAGNOSIS — Z8249 Family history of ischemic heart disease and other diseases of the circulatory system: Secondary | ICD-10-CM | POA: Insufficient documentation

## 2018-12-15 DIAGNOSIS — E785 Hyperlipidemia, unspecified: Secondary | ICD-10-CM | POA: Insufficient documentation

## 2018-12-15 DIAGNOSIS — Z8489 Family history of other specified conditions: Secondary | ICD-10-CM | POA: Diagnosis not present

## 2018-12-15 DIAGNOSIS — I10 Essential (primary) hypertension: Secondary | ICD-10-CM | POA: Diagnosis not present

## 2018-12-15 LAB — ABO/RH: ABO/RH(D): O POS

## 2018-12-15 LAB — GLUCOSE, CAPILLARY
Glucose-Capillary: 193 mg/dL — ABNORMAL HIGH (ref 70–99)
Glucose-Capillary: 158 mg/dL — ABNORMAL HIGH (ref 70–99)

## 2018-12-15 SURGERY — EXAM UNDER ANESTHESIA
Anesthesia: General

## 2018-12-15 MED ORDER — EPHEDRINE SULFATE 50 MG/ML IJ SOLN
INTRAMUSCULAR | Status: DC | PRN
  Administered 2018-12-15 (×2): 10 mg via INTRAVENOUS

## 2018-12-15 MED ORDER — FAMOTIDINE 20 MG PO TABS
ORAL_TABLET | ORAL | Status: AC
  Administered 2018-12-15: 11:00:00 20 mg via ORAL
  Filled 2018-12-15: qty 1

## 2018-12-15 MED ORDER — MIDAZOLAM HCL 2 MG/2ML IJ SOLN
INTRAMUSCULAR | Status: DC | PRN
  Administered 2018-12-15: 2 mg via INTRAVENOUS

## 2018-12-15 MED ORDER — LIDOCAINE-EPINEPHRINE 1 %-1:100000 IJ SOLN
INTRAMUSCULAR | Status: AC
  Filled 2018-12-15: qty 1

## 2018-12-15 MED ORDER — EPHEDRINE SULFATE 50 MG/ML IJ SOLN
INTRAMUSCULAR | Status: AC
  Filled 2018-12-15: qty 1

## 2018-12-15 MED ORDER — ONDANSETRON HCL 4 MG/2ML IJ SOLN: 4.0000 mg | Freq: Once | INTRAMUSCULAR | Status: DC | PRN

## 2018-12-15 MED ORDER — FENTANYL CITRATE (PF) 100 MCG/2ML IJ SOLN
INTRAMUSCULAR | Status: AC
  Filled 2018-12-15: qty 2

## 2018-12-15 MED ORDER — PROPOFOL 10 MG/ML IV BOLUS
INTRAVENOUS | Status: AC
  Filled 2018-12-15: qty 20

## 2018-12-15 MED ORDER — SODIUM CHLORIDE 0.9 % IV SOLN
INTRAVENOUS | Status: DC
  Administered 2018-12-15: 11:00:00 via INTRAVENOUS

## 2018-12-15 MED ORDER — ONDANSETRON HCL 4 MG/2ML IJ SOLN
INTRAMUSCULAR | Status: DC | PRN
  Administered 2018-12-15: 4 mg via INTRAVENOUS

## 2018-12-15 MED ORDER — FAMOTIDINE 20 MG PO TABS
20.0000 mg | ORAL_TABLET | Freq: Once | ORAL | Status: AC
  Administered 2018-12-15: 20 mg via ORAL

## 2018-12-15 MED ORDER — FENTANYL CITRATE (PF) 100 MCG/2ML IJ SOLN: 25.0000 ug | INTRAMUSCULAR | Status: DC | PRN

## 2018-12-15 MED ORDER — CEFAZOLIN SODIUM-DEXTROSE 2-4 GM/100ML-% IV SOLN
INTRAVENOUS | Status: AC
  Filled 2018-12-15: qty 100

## 2018-12-15 MED ORDER — SUGAMMADEX SODIUM 200 MG/2ML IV SOLN
INTRAVENOUS | Status: DC | PRN
  Administered 2018-12-15: 200 mg via INTRAVENOUS

## 2018-12-15 MED ORDER — MIDAZOLAM HCL 2 MG/2ML IJ SOLN
INTRAMUSCULAR | Status: AC
  Filled 2018-12-15: qty 2

## 2018-12-15 MED ORDER — DEXAMETHASONE SODIUM PHOSPHATE 10 MG/ML IJ SOLN
INTRAMUSCULAR | Status: DC | PRN
  Administered 2018-12-15: 10 mg via INTRAVENOUS

## 2018-12-15 MED ORDER — SUGAMMADEX SODIUM 200 MG/2ML IV SOLN
INTRAVENOUS | Status: AC
  Filled 2018-12-15: qty 2

## 2018-12-15 MED ORDER — CEFAZOLIN SODIUM-DEXTROSE 2-4 GM/100ML-% IV SOLN
2.0000 g | INTRAVENOUS | Status: AC
  Administered 2018-12-15: 2 g via INTRAVENOUS

## 2018-12-15 MED ORDER — LACTATED RINGERS IV SOLN
INTRAVENOUS | Status: DC
  Administered 2018-12-15: 11:00:00 via INTRAVENOUS

## 2018-12-15 MED ORDER — METOPROLOL TARTRATE 5 MG/5ML IV SOLN
INTRAVENOUS | Status: DC | PRN
  Administered 2018-12-15: 2 mg via INTRAVENOUS

## 2018-12-15 MED ORDER — ROCURONIUM BROMIDE 100 MG/10ML IV SOLN
INTRAVENOUS | Status: DC | PRN
  Administered 2018-12-15: 20 mg via INTRAVENOUS

## 2018-12-15 MED ORDER — FENTANYL CITRATE (PF) 100 MCG/2ML IJ SOLN
INTRAMUSCULAR | Status: DC | PRN
  Administered 2018-12-15: 100 ug via INTRAVENOUS

## 2018-12-15 SURGICAL SUPPLY — 33 items
BAG URINE DRAINAGE (UROLOGICAL SUPPLIES) ×3 IMPLANT
CANISTER SUCT 1200ML W/VALVE (MISCELLANEOUS) ×3 IMPLANT
CATH FOLEY 2WAY  5CC 16FR (CATHETERS) ×2
CATH ROBINSON RED A/P 16FR (CATHETERS) ×3 IMPLANT
CATH URTH 16FR FL 2W BLN LF (CATHETERS) ×1 IMPLANT
COVER WAND RF STERILE (DRAPES) ×3 IMPLANT
DRAPE PERI LITHO V/GYN (MISCELLANEOUS) ×3 IMPLANT
DRAPE UNDER BUTTOCK W/FLU (DRAPES) ×3 IMPLANT
ELECT REM PT RETURN 9FT ADLT (ELECTROSURGICAL) ×3
ELECTRODE REM PT RTRN 9FT ADLT (ELECTROSURGICAL) ×1 IMPLANT
ETHIBOND 2 0 GREEN CT 2 30IN (SUTURE) IMPLANT
GAUZE 4X4 16PLY RFD (DISPOSABLE) ×3 IMPLANT
GAUZE PACK 2X3YD (GAUZE/BANDAGES/DRESSINGS) ×3 IMPLANT
GLOVE BIO SURGEON STRL SZ7 (GLOVE) ×12 IMPLANT
GLOVE INDICATOR 7.5 STRL GRN (GLOVE) ×12 IMPLANT
GOWN STRL REUS W/ TWL LRG LVL3 (GOWN DISPOSABLE) ×1 IMPLANT
GOWN STRL REUS W/ TWL XL LVL3 (GOWN DISPOSABLE) ×1 IMPLANT
GOWN STRL REUS W/TWL LRG LVL3 (GOWN DISPOSABLE) ×2
GOWN STRL REUS W/TWL XL LVL3 (GOWN DISPOSABLE) ×2
KIT TURNOVER CYSTO (KITS) ×3 IMPLANT
LABEL OR SOLS (LABEL) ×3 IMPLANT
NEEDLE HYPO 22GX1.5 SAFETY (NEEDLE) ×3 IMPLANT
NS IRRIG 500ML POUR BTL (IV SOLUTION) ×3 IMPLANT
PACK BASIN MINOR ARMC (MISCELLANEOUS) ×3 IMPLANT
PAD OB MATERNITY 4.3X12.25 (PERSONAL CARE ITEMS) ×3 IMPLANT
PAD PREP 24X41 OB/GYN DISP (PERSONAL CARE ITEMS) ×3 IMPLANT
SUT PDS AB 2-0 CT1 27 (SUTURE) IMPLANT
SUT VIC AB 2-0 CT1 36 (SUTURE) ×3 IMPLANT
SUT VIC AB 2-0 SH 27 (SUTURE) ×2
SUT VIC AB 2-0 SH 27XBRD (SUTURE) ×1 IMPLANT
SUT VIC AB 3-0 SH 27 (SUTURE)
SUT VIC AB 3-0 SH 27X BRD (SUTURE) IMPLANT
SYR CONTROL 10ML (SYRINGE) ×3 IMPLANT

## 2018-12-15 NOTE — Anesthesia Procedure Notes (Signed)
Procedure Name: Intubation Date/Time: 12/15/2018 11:08 AM Performed by: Philbert Riser, CRNA Pre-anesthesia Checklist: Patient identified, Emergency Drugs available, Suction available, Patient being monitored and Timeout performed Patient Re-evaluated:Patient Re-evaluated prior to induction Oxygen Delivery Method: Circle system utilized and Simple face mask Preoxygenation: Pre-oxygenation with 100% oxygen Induction Type: IV induction Ventilation: Mask ventilation without difficulty Laryngoscope Size: McGraph and 3 Grade View: Grade III Tube type: Oral Tube size: 7.0 mm Number of attempts: 1 Airway Equipment and Method: Stylet Placement Confirmation: ETT inserted through vocal cords under direct vision,  positive ETCO2 and breath sounds checked- equal and bilateral Secured at: 21 cm Tube secured with: Tape Dental Injury: Teeth and Oropharynx as per pre-operative assessment

## 2018-12-15 NOTE — Anesthesia Post-op Follow-up Note (Signed)
Anesthesia QCDR form completed.        

## 2018-12-15 NOTE — Transfer of Care (Signed)
Immediate Anesthesia Transfer of Care Note  Patient: Hannah Rangel  Procedure(s) Performed: EXAM UNDER ANESTHESIA (N/A )  Patient Location: PACU  Anesthesia Type:General  Level of Consciousness: awake and alert   Airway & Oxygen Therapy: Patient Spontanous Breathing and Patient connected to face mask oxygen  Post-op Assessment: Report given to RN and Post -op Vital signs reviewed and stable  Post vital signs: Reviewed and stable  Last Vitals:  Vitals Value Taken Time  BP 170/80 12/15/18 1204  Temp 36.4 C 12/15/18 1204  Pulse 88 12/15/18 1206  Resp 23 12/15/18 1206  SpO2 100 % 12/15/18 1206  Vitals shown include unvalidated device data.  Last Pain:  Vitals:   12/15/18 1204  PainSc: Asleep         Complications: No apparent anesthesia complications

## 2018-12-15 NOTE — OR Nursing (Signed)
Dr. Leafy Ro in to speak with patient 144 pm.

## 2018-12-15 NOTE — Op Note (Signed)
Hannah Rangel PROCEDURE DATE: 12/15/2018   PREOPERATIVE DIAGNOSIS: Anterior pelvic organ prolapse, apical prolapse POSTOPERATIVE DIAGNOSIS: Anterior, apical, lateral and posterior prolapse PROCEDURE: Exam under anesthesia SURGEON:  Dr. Angelina Pih  ASSISTANT: Dr. Gwen Her Schermerhorn ANESTHESIOLOGIST: Anesthesiologist: Alvin Critchley, MD CRNA: Doreen Salvage, CRNA; Philbert Riser, CRNA  INDICATIONS: 73 y.o. F presented for bothersome prolapse repair who presented for surgical management.   Risks of surgery were discussed with the patient including but not limited to: bleeding which may require transfusion or reoperation; infection which may require antibiotics; injury to bowel, bladder, ureters or other surrounding organs; need for additional procedures including laparotomy; thromboembolic phenomenon, incisional problems and other postoperative/anesthesia complications. Written informed consent was obtained.   FINDINGS:  Significant anterior, apical lateral and posterior prolapse, to such an extent that no vaginal support was noted. Under anesthesia, her entire vaginal vault protruded to the introitus, and without a sacrocolpopexy or even more drastic obliterative measures, anterior repair would help her minimally if at all. Exam in the office with Valsalva did not show the extent of the apical or posterior prolapse, and I would be hard-pressed to find lateral support structures to plicate without apical repair.  ANESTHESIA:    General INTRAVENOUS FLUIDS: 800 ml ESTIMATED BLOOD LOSS: none SPECIMENS: None COMPLICATIONS: None immediate  She did receive 2g Ancef today.  Anterior repair planned, but exam under anesthesia with the above findings was preformed.  The patient was taken to the operating room where spinal anesthesia was administered and found to be adequate.  She was then placed in a dorsal lithotomy position using stirrups, and she was prepped and draped in a sterile manner.  A large chunk of vaginal estrogen cream was extracted from her posterior cul de sac. A vaginal exam confirmed marked vaginal vault prolapse, enterocele and cystocele with apical prolapse to the introitus.   Attention was then turned to the anterior vaginal mucosa that was grasped in the midline approx 1cm proximate to the ureteral opening using an Allis clamp. Two Allis clamps were used to identify the terminus of the planned incision just distal to the vaginal cuff and after careful evaluation with full pelvic floor relaxation under anesthesia, the decision was made to terminate the procedure. I considered a perineorrhaphy to assist with pessary retention but having her recover from any vaginal incision when a full surgery is in her best interest dissuaded me.   My plan will be to continue vaginal estrogen and fit her for a cube pessary (likely size 5 or larger will be needed) and encourage her strongly to proceed to urogynecology for surgical repair. I spoke with two sisters and her daughter about these developments in the postoperative recovery room.    The patient tolerated the procedure well without complications.  Sponge, lap, instrument and needle counts were correct x2.  She was taken to the recovery room in stable condition.

## 2018-12-15 NOTE — Anesthesia Preprocedure Evaluation (Addendum)
Anesthesia Evaluation  Patient identified by MRN, date of birth, ID band Patient awake    Reviewed: Allergy & Precautions, NPO status , Patient's Chart, lab work & pertinent test results, reviewed documented beta blocker date and time   History of Anesthesia Complications Negative for: history of anesthetic complications  Airway Mallampati: III  TM Distance: <3 FB   Mouth opening: Limited Mouth Opening  Dental   Pulmonary neg pulmonary ROS,    Pulmonary exam normal        Cardiovascular hypertension, Pt. on medications and Pt. on home beta blockers Normal cardiovascular exam+ Valvular Problems/Murmurs      Neuro/Psych negative neurological ROS     GI/Hepatic negative GI ROS, Neg liver ROS,   Endo/Other  diabetes, Type 2, Oral Hypoglycemic Agents  Renal/GU negative Renal ROS  Female GU complaint     Musculoskeletal  (+) Arthritis ,   Abdominal Normal abdominal exam  (+)   Peds  Hematology negative hematology ROS (+)   Anesthesia Other Findings   Reproductive/Obstetrics                            Anesthesia Physical  Anesthesia Plan  ASA: III  Anesthesia Plan: General   Post-op Pain Management:    Induction: Intravenous  PONV Risk Score and Plan:   Airway Management Planned: Oral ETT  Additional Equipment:   Intra-op Plan:   Post-operative Plan: Extubation in OR  Informed Consent: I have reviewed the patients History and Physical, chart, labs and discussed the procedure including the risks, benefits and alternatives for the proposed anesthesia with the patient or authorized representative who has indicated his/her understanding and acceptance.       Plan Discussed with: CRNA and Surgeon  Anesthesia Plan Comments:         Anesthesia Quick Evaluation

## 2018-12-15 NOTE — Discharge Instructions (Addendum)
Watch for any signs of bladder infection, and please continue to use vaginal estrogen until your next surgery.    AMBULATORY SURGERY  DISCHARGE INSTRUCTIONS   1) The drugs that you were given will stay in your system until tomorrow so for the next 24 hours you should not:  A) Drive an automobile B) Make any legal decisions C) Drink any alcoholic beverage   2) You may resume regular meals tomorrow.  Today it is better to start with liquids and gradually work up to solid foods.  You may eat anything you prefer, but it is better to start with liquids, then soup and crackers, and gradually work up to solid foods.   3) Please notify your doctor immediately if you have any unusual bleeding, trouble breathing, redness and pain at the surgery site, drainage, fever, or pain not relieved by medication.    4) Additional Instructions:        Please contact your physician with any problems or Same Day Surgery at [phone], Monday through Friday 6 am to 4 pm, or  at [employer] number at [phone].

## 2018-12-16 NOTE — Anesthesia Postprocedure Evaluation (Signed)
Anesthesia Post Note  Patient: Hannah Rangel  Procedure(s) Performed: EXAM UNDER ANESTHESIA (N/A )  Patient location during evaluation: PACU Anesthesia Type: General Level of consciousness: awake and alert and oriented Pain management: pain level controlled Vital Signs Assessment: post-procedure vital signs reviewed and stable Respiratory status: spontaneous breathing Cardiovascular status: blood pressure returned to baseline Anesthetic complications: no     Last Vitals:  Vitals:   12/15/18 1305 12/15/18 1350  BP: (!) 174/78 (!) 193/85  Pulse: 88 99  Resp: 18 16  Temp: 36.7 C   SpO2: 96% 97%    Last Pain:  Vitals:   12/15/18 1350  TempSrc:   PainSc: 4                  Shayona Hibbitts

## 2018-12-28 NOTE — Discharge Summary (Signed)
Patient discharged home after exam under anesthesia without surgical repair. She was referred to urogyn for further management.

## 2019-03-17 ENCOUNTER — Inpatient Hospital Stay: Admission: RE | Admit: 2019-03-17 | Payer: Medicare Other | Source: Ambulatory Visit

## 2019-03-20 ENCOUNTER — Other Ambulatory Visit: Admission: RE | Admit: 2019-03-20 | Payer: Medicare HMO | Source: Ambulatory Visit

## 2019-03-20 ENCOUNTER — Ambulatory Visit: Admit: 2019-03-20 | Payer: Medicare Other | Admitting: Unknown Physician Specialty

## 2019-03-20 SURGERY — COLONOSCOPY WITH PROPOFOL
Anesthesia: General

## 2019-03-25 ENCOUNTER — Ambulatory Visit: Admission: RE | Admit: 2019-03-25 | Payer: Medicare HMO | Source: Home / Self Care | Admitting: Internal Medicine

## 2019-03-25 ENCOUNTER — Encounter: Admission: RE | Payer: Self-pay | Source: Home / Self Care

## 2019-03-25 SURGERY — COLONOSCOPY WITH PROPOFOL
Anesthesia: General

## 2020-03-04 ENCOUNTER — Ambulatory Visit: Payer: Medicare HMO | Attending: Internal Medicine

## 2020-03-04 DIAGNOSIS — Z23 Encounter for immunization: Secondary | ICD-10-CM

## 2020-03-04 NOTE — Progress Notes (Signed)
   Covid-19 Vaccination Clinic  Name:  Hannah Rangel    MRN: [id number] DOB: 01-29-1946  03/04/2020  Ms. Hannah Rangel was observed post Covid-19 immunization for 15 minutes without incident. She was provided with Vaccine Information Sheet and instruction to access the V-Safe system.   Ms. Hannah Rangel was instructed to call 911 with any severe reactions post vaccine: Marland Kitchen Difficulty breathing  . Swelling of face and throat  . A fast heartbeat  . A bad rash all over body  . Dizziness and weakness   Immunizations Administered    Name Date Dose VIS Date Route   Pfizer COVID-19 Vaccine 03/04/2020 10:00 AM 0.3 mL 08/05/2018 Intramuscular   Manufacturer: [place]   Lot: [id number]   [place]: [id number]

## 2020-03-25 ENCOUNTER — Ambulatory Visit: Payer: Medicare HMO | Attending: Internal Medicine

## 2020-03-25 ENCOUNTER — Other Ambulatory Visit: Payer: Self-pay | Admitting: Internal Medicine

## 2020-03-25 DIAGNOSIS — Z23 Encounter for immunization: Secondary | ICD-10-CM

## 2020-03-25 NOTE — Progress Notes (Signed)
   Covid-19 Vaccination Clinic  Name:  Jhana Giarratano    MRN: [id number] DOB: 1946/03/01  03/25/2020  Ms. Tucciarone was observed post Covid-19 immunization for 15 minutes without incident. She was provided with Vaccine Information Sheet and instruction to access the V-Safe system.   Ms. Lipson was instructed to call 911 with any severe reactions post vaccine: Marland Kitchen Difficulty breathing  . Swelling of face and throat  . A fast heartbeat  . A bad rash all over body  . Dizziness and weakness   Immunizations Administered    Name Date Dose VIS Date Route   Pfizer COVID-19 Vaccine 03/25/2020 10:09 AM 0.3 mL 08/05/2018 Intramuscular   Manufacturer: [place]   Lot: [id number]   [place]: [id number]

## 2020-12-20 DIAGNOSIS — E119 Type 2 diabetes mellitus without complications: Secondary | ICD-10-CM | POA: Diagnosis not present

## 2020-12-20 DIAGNOSIS — E1142 Type 2 diabetes mellitus with diabetic polyneuropathy: Secondary | ICD-10-CM | POA: Diagnosis not present

## 2021-03-24 DIAGNOSIS — E1142 Type 2 diabetes mellitus with diabetic polyneuropathy: Secondary | ICD-10-CM | POA: Diagnosis not present

## 2021-03-31 DIAGNOSIS — Z1231 Encounter for screening mammogram for malignant neoplasm of breast: Secondary | ICD-10-CM | POA: Diagnosis not present

## 2021-03-31 DIAGNOSIS — I10 Essential (primary) hypertension: Secondary | ICD-10-CM | POA: Diagnosis not present

## 2021-03-31 DIAGNOSIS — Z23 Encounter for immunization: Secondary | ICD-10-CM | POA: Diagnosis not present

## 2021-03-31 DIAGNOSIS — E1142 Type 2 diabetes mellitus with diabetic polyneuropathy: Secondary | ICD-10-CM | POA: Diagnosis not present

## 2021-04-03 ENCOUNTER — Other Ambulatory Visit: Payer: Self-pay | Admitting: Internal Medicine

## 2021-04-03 DIAGNOSIS — Z1231 Encounter for screening mammogram for malignant neoplasm of breast: Secondary | ICD-10-CM

## 2021-08-10 ENCOUNTER — Other Ambulatory Visit: Payer: Self-pay | Admitting: Physician Assistant

## 2021-08-10 DIAGNOSIS — M170 Bilateral primary osteoarthritis of knee: Secondary | ICD-10-CM | POA: Diagnosis not present

## 2021-08-10 DIAGNOSIS — I10 Essential (primary) hypertension: Secondary | ICD-10-CM | POA: Diagnosis not present

## 2021-08-10 DIAGNOSIS — Z1231 Encounter for screening mammogram for malignant neoplasm of breast: Secondary | ICD-10-CM

## 2021-08-10 DIAGNOSIS — E782 Mixed hyperlipidemia: Secondary | ICD-10-CM | POA: Diagnosis not present

## 2021-08-10 DIAGNOSIS — Z0000 Encounter for general adult medical examination without abnormal findings: Secondary | ICD-10-CM | POA: Diagnosis not present

## 2021-08-10 DIAGNOSIS — E1165 Type 2 diabetes mellitus with hyperglycemia: Secondary | ICD-10-CM | POA: Diagnosis not present

## 2022-08-29 DIAGNOSIS — I10 Essential (primary) hypertension: Secondary | ICD-10-CM | POA: Diagnosis not present

## 2022-08-29 DIAGNOSIS — E1165 Type 2 diabetes mellitus with hyperglycemia: Secondary | ICD-10-CM | POA: Diagnosis not present

## 2022-08-29 DIAGNOSIS — H35363 Drusen (degenerative) of macula, bilateral: Secondary | ICD-10-CM | POA: Diagnosis not present

## 2022-08-29 DIAGNOSIS — E782 Mixed hyperlipidemia: Secondary | ICD-10-CM | POA: Diagnosis not present

## 2022-08-29 DIAGNOSIS — M170 Bilateral primary osteoarthritis of knee: Secondary | ICD-10-CM | POA: Diagnosis not present

## 2022-08-29 DIAGNOSIS — E1140 Type 2 diabetes mellitus with diabetic neuropathy, unspecified: Secondary | ICD-10-CM | POA: Diagnosis not present

## 2022-08-30 DIAGNOSIS — E1169 Type 2 diabetes mellitus with other specified complication: Secondary | ICD-10-CM | POA: Diagnosis not present

## 2022-08-30 DIAGNOSIS — E1142 Type 2 diabetes mellitus with diabetic polyneuropathy: Secondary | ICD-10-CM | POA: Diagnosis not present

## 2022-08-30 DIAGNOSIS — Z599 Problem related to housing and economic circumstances, unspecified: Secondary | ICD-10-CM | POA: Diagnosis not present

## 2022-08-30 DIAGNOSIS — E785 Hyperlipidemia, unspecified: Secondary | ICD-10-CM | POA: Diagnosis not present

## 2022-08-30 DIAGNOSIS — I10 Essential (primary) hypertension: Secondary | ICD-10-CM | POA: Diagnosis not present

## 2022-08-30 DIAGNOSIS — E1165 Type 2 diabetes mellitus with hyperglycemia: Secondary | ICD-10-CM | POA: Diagnosis not present

## 2022-09-21 DIAGNOSIS — E119 Type 2 diabetes mellitus without complications: Secondary | ICD-10-CM | POA: Diagnosis not present

## 2022-09-21 DIAGNOSIS — H2513 Age-related nuclear cataract, bilateral: Secondary | ICD-10-CM | POA: Diagnosis not present

## 2022-09-21 DIAGNOSIS — H353111 Nonexudative age-related macular degeneration, right eye, early dry stage: Secondary | ICD-10-CM | POA: Diagnosis not present

## 2022-09-21 DIAGNOSIS — H353121 Nonexudative age-related macular degeneration, left eye, early dry stage: Secondary | ICD-10-CM | POA: Diagnosis not present

## 2022-09-21 DIAGNOSIS — H2512 Age-related nuclear cataract, left eye: Secondary | ICD-10-CM | POA: Diagnosis not present

## 2022-10-08 DIAGNOSIS — H2512 Age-related nuclear cataract, left eye: Secondary | ICD-10-CM | POA: Diagnosis not present

## 2022-10-08 DIAGNOSIS — H353111 Nonexudative age-related macular degeneration, right eye, early dry stage: Secondary | ICD-10-CM | POA: Diagnosis not present

## 2022-11-19 ENCOUNTER — Encounter: Payer: Self-pay | Admitting: Ophthalmology

## 2022-11-19 NOTE — Anesthesia Preprocedure Evaluation (Signed)
Anesthesia Evaluation    Airway        Dental   Pulmonary           Cardiovascular hypertension,      Neuro/Psych    GI/Hepatic   Endo/Other  diabetes  Renal/GU      Musculoskeletal   Abdominal   Peds  Hematology   Anesthesia Other Findings   Reproductive/Obstetrics                             Anesthesia Physical Anesthesia Plan Anesthesia Quick Evaluation  

## 2022-11-21 NOTE — Discharge Instructions (Signed)

## 2022-11-22 ENCOUNTER — Ambulatory Visit
Admission: RE | Admit: 2022-11-22 | Discharge: 2022-11-22 | Disposition: A | Discharge status: Home / Self Care | Payer: 59 | Attending: Ophthalmology | Admitting: Ophthalmology

## 2022-11-22 ENCOUNTER — Encounter
Admission: RE | Disposition: A | Discharge status: Home / Self Care | Payer: Self-pay | Source: Home / Self Care | Attending: Ophthalmology

## 2022-11-22 ENCOUNTER — Encounter: Payer: Self-pay | Admitting: Ophthalmology

## 2022-11-22 ENCOUNTER — Ambulatory Visit: Payer: 59 | Admitting: Anesthesiology

## 2022-11-22 ENCOUNTER — Other Ambulatory Visit: Payer: Self-pay

## 2022-11-22 DIAGNOSIS — Z79899 Other long term (current) drug therapy: Secondary | ICD-10-CM | POA: Diagnosis not present

## 2022-11-22 DIAGNOSIS — I10 Essential (primary) hypertension: Secondary | ICD-10-CM | POA: Insufficient documentation

## 2022-11-22 DIAGNOSIS — H2512 Age-related nuclear cataract, left eye: Secondary | ICD-10-CM | POA: Insufficient documentation

## 2022-11-22 DIAGNOSIS — E1142 Type 2 diabetes mellitus with diabetic polyneuropathy: Secondary | ICD-10-CM | POA: Diagnosis not present

## 2022-11-22 DIAGNOSIS — E1136 Type 2 diabetes mellitus with diabetic cataract: Secondary | ICD-10-CM | POA: Diagnosis not present

## 2022-11-22 DIAGNOSIS — Z7984 Long term (current) use of oral hypoglycemic drugs: Secondary | ICD-10-CM | POA: Insufficient documentation

## 2022-11-22 HISTORY — DX: Type 2 diabetes mellitus with diabetic polyneuropathy: E11.42

## 2022-11-22 HISTORY — PX: CATARACT EXTRACTION W/PHACO: SHX586

## 2022-11-22 LAB — GLUCOSE, CAPILLARY: Glucose-Capillary: 144 mg/dL — ABNORMAL HIGH (ref 70–99)

## 2022-11-22 SURGERY — PHACOEMULSIFICATION, CATARACT, WITH IOL INSERTION
Anesthesia: Monitor Anesthesia Care | Site: Eye | Laterality: Left

## 2022-11-22 MED ORDER — SODIUM CHLORIDE 0.9% FLUSH
INTRAVENOUS | Status: DC | PRN
  Administered 2022-11-22: 10 mL via INTRAVENOUS

## 2022-11-22 MED ORDER — SIGHTPATH DOSE#1 BSS IO SOLN
INTRAOCULAR | Status: DC | PRN
  Administered 2022-11-22: 15 mL

## 2022-11-22 MED ORDER — MOXIFLOXACIN HCL 0.5 % OP SOLN
OPHTHALMIC | Status: DC | PRN
  Administered 2022-11-22: .2 mL via OPHTHALMIC

## 2022-11-22 MED ORDER — BRIMONIDINE TARTRATE-TIMOLOL 0.2-0.5 % OP SOLN
OPHTHALMIC | Status: DC | PRN
  Administered 2022-11-22: 1 [drp] via OPHTHALMIC

## 2022-11-22 MED ORDER — MIDAZOLAM HCL 2 MG/2ML IJ SOLN
INTRAMUSCULAR | Status: DC | PRN
  Administered 2022-11-22: 1 mg via INTRAVENOUS

## 2022-11-22 MED ORDER — SIGHTPATH DOSE#1 BSS IO SOLN
INTRAOCULAR | Status: DC | PRN
  Administered 2022-11-22: 62 mL via OPHTHALMIC

## 2022-11-22 MED ORDER — TETRACAINE HCL 0.5 % OP SOLN
1.0000 [drp] | OPHTHALMIC | Status: DC | PRN
  Administered 2022-11-22 (×3): 1 [drp] via OPHTHALMIC

## 2022-11-22 MED ORDER — SIGHTPATH DOSE#1 NA HYALUR & NA CHOND-NA HYALUR IO KIT
PACK | INTRAOCULAR | Status: DC | PRN
  Administered 2022-11-22: 1 via OPHTHALMIC

## 2022-11-22 MED ORDER — FENTANYL CITRATE (PF) 100 MCG/2ML IJ SOLN
INTRAMUSCULAR | Status: DC | PRN
  Administered 2022-11-22: 50 ug via INTRAVENOUS

## 2022-11-22 MED ORDER — LIDOCAINE HCL (PF) 2 % IJ SOLN
INTRAOCULAR | Status: DC | PRN
  Administered 2022-11-22: 1 mL via INTRAOCULAR

## 2022-11-22 MED ORDER — ARMC OPHTHALMIC DILATING DROPS
1.0000 | OPHTHALMIC | Status: DC | PRN
  Administered 2022-11-22 (×3): 1 via OPHTHALMIC

## 2022-11-22 SURGICAL SUPPLY — 11 items
CANNULA ANT/CHMB 27G (MISCELLANEOUS) IMPLANT
CANNULA ANT/CHMB 27GA (MISCELLANEOUS) IMPLANT
CATARACT SUITE SIGHTPATH (MISCELLANEOUS) ×1 IMPLANT
DISSECTOR HYDRO NUCLEUS 50X22 (MISCELLANEOUS) ×1 IMPLANT
DRSG TEGADERM 2-3/8X2-3/4 SM (GAUZE/BANDAGES/DRESSINGS) ×1 IMPLANT
FEE CATARACT SUITE SIGHTPATH (MISCELLANEOUS) ×1 IMPLANT
GLOVE SURG SYN 7.5 E (GLOVE) ×1 IMPLANT
GLOVE SURG SYN 7.5 PF PI (GLOVE) ×1 IMPLANT
GLOVE SURG SYN 8.5 E (GLOVE) ×1 IMPLANT
GLOVE SURG SYN 8.5 PF PI (GLOVE) ×1 IMPLANT
LENS IOL TECNIS EYHANCE 21.5 (Intraocular Lens) IMPLANT

## 2022-11-22 NOTE — Transfer of Care (Signed)
Immediate Anesthesia Transfer of Care Note  Patient: Hannah Rangel  Procedure(s) Performed: CATARACT EXTRACTION PHACO AND INTRAOCULAR LENS PLACEMENT (IOC) LEFT VISION BLUE (Left: Eye)  Patient Location: PACU  Anesthesia Type:MAC  Level of Consciousness: awake, alert , and oriented  Airway & Oxygen Therapy: Patient Spontanous Breathing  Post-op Assessment: Report given to RN and Post -op Vital signs reviewed and stable  Post vital signs: Reviewed and stable  Last Vitals: See PACU flow sheet for V/S  Vitals Value Taken Time  BP    Temp    Pulse 84 11/22/22 1416  Resp 19 11/22/22 1416  SpO2 92 % 11/22/22 1416  Vitals shown include unvalidated device data.  Last Pain:  Vitals:   11/22/22 1226  TempSrc: Temporal  PainSc: 0-No pain         Complications: No notable events documented.

## 2022-11-22 NOTE — Op Note (Signed)
OPERATIVE NOTE  Hannah Rangel [id number] 11/22/2022   PREOPERATIVE DIAGNOSIS: Nuclear sclerotic cataract left eye. H25.12   POSTOPERATIVE DIAGNOSIS: Nuclear sclerotic cataract left eye. H25.12   PROCEDURE:  Phacoemusification with posterior chamber intraocular lens placement of the left eye  Ultrasound time: Procedure(s) with comments: CATARACT EXTRACTION PHACO AND INTRAOCULAR LENS PLACEMENT (IOC) LEFT VISION BLUE (Left) - 3.88 0:44.0  LENS:   Implant Name Type Inv. Item Serial No. Manufacturer Lot No. LRB No. Used Action  LENS IOL TECNIS EYHANCE 21.5 - [id number] Intraocular Lens LENS IOL TECNIS EYHANCE 21.5 [id number] [employer]  Left 1 Implanted      SURGEON:  Julious Payer. Rolley Sims, MD   ANESTHESIA:  Topical with tetracaine drops, augmented with 1% preservative-free intracameral lidocaine.   COMPLICATIONS:  None.   DESCRIPTION OF PROCEDURE:  The patient was identified in the holding room and transported to the operating room and placed in the supine position under the operating microscope.  The left eye was identified as the operative eye, which was prepped and draped in the usual sterile ophthalmic fashion.   A 1 millimeter clear-corneal paracentesis was made inferotemporally. Preservative-free 1% lidocaine mixed with 1:1,000 bisulfite-free aqueous solution of epinephrine was injected into the anterior chamber. The anterior chamber was then filled with Viscoat viscoelastic. A 2.4 millimeter keratome was used to make a clear-corneal incision superotemporally. A curvilinear capsulorrhexis was made with a cystotome and capsulorrhexis forceps. Balanced salt solution was used to hydrodissect and hydrodelineate the nucleus. Phacoemulsification was then used to remove the lens nucleus and epinucleus. The remaining cortex was then removed using the irrigation and aspiration handpiece. Provisc was then placed into the capsular bag to distend it for lens placement. A +21.50 D intraocular lens was  then injected into the capsular bag. The remaining viscoelastic was aspirated.   Wounds were hydrated with balanced salt solution.  The anterior chamber was inflated to a physiologic pressure with balanced salt solution.  No wound leaks were noted. Vigamox was injected intracamerally.  Timolol and Brimonidine drops were applied to the eye.  The patient was taken to the recovery room in stable condition without complications of anesthesia or surgery.  Hannah Rangel 11/22/2022, 2:13 PM

## 2022-11-22 NOTE — H&P (Signed)
[facility]   Primary Care Physician:  Patrice Paradise, MD Ophthalmologist: Dr. Deberah Pelton  Pre-Procedure History & Physical: HPI:  Hannah Rangel is a 77 y.o. female here for cataract surgery.   Past Medical History:  Diagnosis Date   Arthritis    Hips, knees   Diabetes mellitus without complication (HCC)    Heart murmur    Hypertension    Type 2 diabetes mellitus with diabetic polyneuropathy [facility])     Past Surgical History:  Procedure Laterality Date   ABDOMINAL HYSTERECTOMY     CHOLECYSTECTOMY     HERNIA REPAIR     Ventral Hernia Repair   MASS EXCISION Left 05/20/2015   Procedure: excision connective tissue tumor left ankle;  Surgeon: Linus Galas, MD;  Location: [facility] ORS;  Service: Podiatry;  Laterality: Left;   THORACOTOMY      Prior to Admission medications   Medication Sig Start Date End Date Taking? Authorizing Provider  amLODipine-benazepril (LOTREL) 10-20 MG capsule Take 1 capsule by mouth daily.   Yes [provider]  aspirin EC 81 MG tablet Take 81 mg by mouth daily.   Yes [provider]  atorvastatin (LIPITOR) 20 MG tablet Take 20 mg by mouth daily.   Yes [provider]  hydrochlorothiazide (HYDRODIURIL) 25 MG tablet Take 25 mg by mouth daily.   Yes [provider]  metFORMIN (GLUCOPHAGE) 1000 MG tablet Take 1,000 mg by mouth 2 (two) times daily with a meal.   Yes [provider]  metoprolol (LOPRESSOR) 50 MG tablet Take 50 mg by mouth daily.    Yes [provider]  potassium chloride SA (KLOR-CON M20) 20 MEQ tablet Take 20 mEq by mouth daily. 10/14/15  Yes [provider]    Allergies as of 09/26/2022   (No Known Allergies)    History reviewed. No pertinent family history.  Social History   Socioeconomic History   Marital status: Single    Spouse name: Not on file   Number of children: Not on file   Years of education: Not on file   Highest education level: Not on file   Occupational History   Not on file  Tobacco Use   Smoking status: Never   Smokeless tobacco: Never  Vaping Use   Vaping Use: Never used  Substance and Sexual Activity   Alcohol use: No   Drug use: No   Sexual activity: Not on file  Other Topics Concern   Not on file  Social History Narrative   Not on file   Social Determinants of Health   Financial Resource Strain: Not on file  Food Insecurity: Not on file  Transportation Needs: Not on file  Physical Activity: Not on file  Stress: Not on file  Social Connections: Not on file  Intimate Partner Violence: Not on file    Review of Systems: See HPI, otherwise negative ROS  Physical Exam: Ht 5\' 7"  (1.702 m)   Wt 86.2 kg   BMI 29.76 kg/m  General:   Alert, cooperative in NAD Head:  Normocephalic and atraumatic. Respiratory:  Normal work of breathing. Cardiovascular:  RRR  Impression/Plan: Hannah Rangel is here for cataract surgery.  Risks, benefits, limitations, and alternatives regarding cataract surgery have been reviewed with the patient.  Questions have been answered.  All parties agreeable.   Estanislado Pandy, MD  11/22/2022, 11:40 AM

## 2022-11-23 ENCOUNTER — Encounter: Payer: Self-pay | Admitting: Ophthalmology

## 2022-11-23 DIAGNOSIS — H2511 Age-related nuclear cataract, right eye: Secondary | ICD-10-CM | POA: Diagnosis not present

## 2022-11-23 DIAGNOSIS — Z961 Presence of intraocular lens: Secondary | ICD-10-CM | POA: Diagnosis not present

## 2022-11-26 NOTE — Anesthesia Postprocedure Evaluation (Signed)
Anesthesia Post Note  Patient: Hannah Rangel  Procedure(s) Performed: CATARACT EXTRACTION PHACO AND INTRAOCULAR LENS PLACEMENT (IOC) LEFT VISION BLUE (Left: Eye)  Patient location during evaluation: PACU Anesthesia Type: MAC Level of consciousness: awake and alert Pain management: pain level controlled Vital Signs Assessment: post-procedure vital signs reviewed and stable Respiratory status: spontaneous breathing, nonlabored ventilation, respiratory function stable and patient connected to nasal cannula oxygen Cardiovascular status: stable and blood pressure returned to baseline Postop Assessment: no apparent nausea or vomiting Anesthetic complications: no   No notable events documented.   Last Vitals:  Vitals:   11/22/22 1415 11/22/22 1420  BP: (!) 155/69 (!) 158/71  Pulse: 84 86  Resp: 19 14  Temp: 36.5 C (!) 36.4 C  SpO2: 94% 95%    Last Pain:  Vitals:   11/22/22 1420  TempSrc:   PainSc: 0-No pain                 Brownie Nehme C Alsha Meland

## 2022-11-29 ENCOUNTER — Other Ambulatory Visit: Payer: Self-pay | Admitting: Physician Assistant

## 2022-11-29 DIAGNOSIS — Z23 Encounter for immunization: Secondary | ICD-10-CM | POA: Diagnosis not present

## 2022-11-29 DIAGNOSIS — M170 Bilateral primary osteoarthritis of knee: Secondary | ICD-10-CM | POA: Diagnosis not present

## 2022-11-29 DIAGNOSIS — Z1211 Encounter for screening for malignant neoplasm of colon: Secondary | ICD-10-CM | POA: Diagnosis not present

## 2022-11-29 DIAGNOSIS — Z1231 Encounter for screening mammogram for malignant neoplasm of breast: Secondary | ICD-10-CM

## 2022-11-29 DIAGNOSIS — E782 Mixed hyperlipidemia: Secondary | ICD-10-CM | POA: Diagnosis not present

## 2022-11-29 DIAGNOSIS — Z0000 Encounter for general adult medical examination without abnormal findings: Secondary | ICD-10-CM | POA: Diagnosis not present

## 2022-11-29 DIAGNOSIS — E1140 Type 2 diabetes mellitus with diabetic neuropathy, unspecified: Secondary | ICD-10-CM | POA: Diagnosis not present

## 2022-11-29 DIAGNOSIS — I10 Essential (primary) hypertension: Secondary | ICD-10-CM | POA: Diagnosis not present

## 2022-12-03 DIAGNOSIS — Z91148 Patient's other noncompliance with medication regimen for other reason: Secondary | ICD-10-CM | POA: Diagnosis not present

## 2022-12-03 DIAGNOSIS — E1165 Type 2 diabetes mellitus with hyperglycemia: Secondary | ICD-10-CM | POA: Diagnosis not present

## 2022-12-03 DIAGNOSIS — I10 Essential (primary) hypertension: Secondary | ICD-10-CM | POA: Diagnosis not present

## 2022-12-03 DIAGNOSIS — E785 Hyperlipidemia, unspecified: Secondary | ICD-10-CM | POA: Diagnosis not present

## 2022-12-03 DIAGNOSIS — E1169 Type 2 diabetes mellitus with other specified complication: Secondary | ICD-10-CM | POA: Diagnosis not present

## 2022-12-03 DIAGNOSIS — E1142 Type 2 diabetes mellitus with diabetic polyneuropathy: Secondary | ICD-10-CM | POA: Diagnosis not present

## 2022-12-03 NOTE — Anesthesia Preprocedure Evaluation (Signed)
Anesthesia Evaluation  Patient identified by MRN, date of birth, ID band Patient awake    Reviewed: Allergy & Precautions, H&P , NPO status , Patient's Chart, lab work & pertinent test results  Airway Mallampati: IV  TM Distance: >3 FB Neck ROM: Full   Comment:  Comment: 1 FB TMD, highly likely to have very anterior and difficult airway if needed intubated.   Dental no notable dental hx.    Pulmonary neg pulmonary ROS   Pulmonary exam normal breath sounds clear to auscultation       Cardiovascular hypertension, negative cardio ROS Normal cardiovascular exam Rhythm:Regular Rate:Normal     Neuro/Psych negative neurological ROS  negative psych ROS   GI/Hepatic negative GI ROS, Neg liver ROS,,,  Endo/Other  negative endocrine ROSdiabetes    Renal/GU negative Renal ROS  negative genitourinary   Musculoskeletal negative musculoskeletal ROS (+)    Abdominal   Peds negative pediatric ROS (+)  Hematology negative hematology ROS (+)   Anesthesia Other Findings Hypertension Arthritis  Heart murmur Type 2 diabetes mellitus with diabetic polyneuropathy (HCC)   Recent cataract surgery 11-22-22    Reproductive/Obstetrics negative OB ROS                             Anesthesia Physical Anesthesia Plan  ASA: 3  Anesthesia Plan: MAC   Post-op Pain Management:    Induction: Intravenous  PONV Risk Score and Plan:   Airway Management Planned: Natural Airway and Nasal Cannula  Additional Equipment:   Intra-op Plan:   Post-operative Plan:   Informed Consent: I have reviewed the patients History and Physical, chart, labs and discussed the procedure including the risks, benefits and alternatives for the proposed anesthesia with the patient or authorized representative who has indicated his/her understanding and acceptance.     Dental Advisory Given  Plan Discussed with: Anesthesiologist,  CRNA and Surgeon  Anesthesia Plan Comments: (Patient consented for risks of anesthesia including but not limited to:  - adverse reactions to medications - damage to eyes, teeth, lips or other oral mucosa - nerve damage due to positioning  - sore throat or hoarseness - Damage to heart, brain, nerves, lungs, other parts of body or loss of life  Patient voiced understanding.)        Anesthesia Quick Evaluation

## 2022-12-04 NOTE — Discharge Instructions (Signed)

## 2022-12-06 ENCOUNTER — Ambulatory Visit: Payer: 59 | Admitting: Anesthesiology

## 2022-12-06 ENCOUNTER — Encounter
Admission: RE | Disposition: A | Discharge status: Home / Self Care | Payer: Self-pay | Source: Home / Self Care | Attending: Ophthalmology

## 2022-12-06 ENCOUNTER — Ambulatory Visit
Admission: RE | Admit: 2022-12-06 | Discharge: 2022-12-06 | Disposition: A | Discharge status: Home / Self Care | Payer: 59 | Attending: Ophthalmology | Admitting: Ophthalmology

## 2022-12-06 ENCOUNTER — Other Ambulatory Visit: Payer: Self-pay

## 2022-12-06 ENCOUNTER — Encounter: Payer: Self-pay | Admitting: Ophthalmology

## 2022-12-06 DIAGNOSIS — H2511 Age-related nuclear cataract, right eye: Secondary | ICD-10-CM | POA: Insufficient documentation

## 2022-12-06 DIAGNOSIS — I10 Essential (primary) hypertension: Secondary | ICD-10-CM | POA: Insufficient documentation

## 2022-12-06 DIAGNOSIS — E1136 Type 2 diabetes mellitus with diabetic cataract: Secondary | ICD-10-CM | POA: Insufficient documentation

## 2022-12-06 DIAGNOSIS — E1142 Type 2 diabetes mellitus with diabetic polyneuropathy: Secondary | ICD-10-CM | POA: Insufficient documentation

## 2022-12-06 DIAGNOSIS — Z7984 Long term (current) use of oral hypoglycemic drugs: Secondary | ICD-10-CM | POA: Diagnosis not present

## 2022-12-06 DIAGNOSIS — M1990 Unspecified osteoarthritis, unspecified site: Secondary | ICD-10-CM | POA: Insufficient documentation

## 2022-12-06 HISTORY — PX: CATARACT EXTRACTION W/PHACO: SHX586

## 2022-12-06 LAB — GLUCOSE, CAPILLARY: Glucose-Capillary: 189 mg/dL — ABNORMAL HIGH (ref 70–99)

## 2022-12-06 SURGERY — PHACOEMULSIFICATION, CATARACT, WITH IOL INSERTION
Anesthesia: Monitor Anesthesia Care | Laterality: Right

## 2022-12-06 MED ORDER — BRIMONIDINE TARTRATE-TIMOLOL 0.2-0.5 % OP SOLN
OPHTHALMIC | Status: DC | PRN
  Administered 2022-12-06: 1 [drp] via OPHTHALMIC

## 2022-12-06 MED ORDER — SIGHTPATH DOSE#1 BSS IO SOLN
INTRAOCULAR | Status: DC | PRN
  Administered 2022-12-06: 15 mL via INTRAOCULAR

## 2022-12-06 MED ORDER — TETRACAINE HCL 0.5 % OP SOLN
1.0000 [drp] | OPHTHALMIC | Status: DC | PRN
  Administered 2022-12-06 (×3): 1 [drp] via OPHTHALMIC

## 2022-12-06 MED ORDER — ARMC OPHTHALMIC DILATING DROPS
1.0000 | OPHTHALMIC | Status: DC | PRN
  Administered 2022-12-06 (×3): 1 via OPHTHALMIC

## 2022-12-06 MED ORDER — SIGHTPATH DOSE#1 NA HYALUR & NA CHOND-NA HYALUR IO KIT
PACK | INTRAOCULAR | Status: DC | PRN
  Administered 2022-12-06: 1 via OPHTHALMIC

## 2022-12-06 MED ORDER — LIDOCAINE HCL (PF) 2 % IJ SOLN
INTRAOCULAR | Status: DC | PRN
  Administered 2022-12-06: 4 mL via INTRAOCULAR

## 2022-12-06 MED ORDER — SIGHTPATH DOSE#1 BSS IO SOLN
INTRAOCULAR | Status: DC | PRN
  Administered 2022-12-06: 72 mL via OPHTHALMIC

## 2022-12-06 MED ORDER — LACTATED RINGERS IV SOLN: INTRAVENOUS | Status: DC

## 2022-12-06 MED ORDER — FENTANYL CITRATE (PF) 100 MCG/2ML IJ SOLN
INTRAMUSCULAR | Status: DC | PRN
  Administered 2022-12-06: 50 ug via INTRAVENOUS

## 2022-12-06 MED ORDER — MOXIFLOXACIN HCL 0.5 % OP SOLN
OPHTHALMIC | Status: DC | PRN
  Administered 2022-12-06: .2 mL via OPHTHALMIC

## 2022-12-06 MED ORDER — MIDAZOLAM HCL 2 MG/2ML IJ SOLN
INTRAMUSCULAR | Status: DC | PRN
  Administered 2022-12-06: 1 mg via INTRAVENOUS

## 2022-12-06 SURGICAL SUPPLY — 9 items
CATARACT SUITE SIGHTPATH (MISCELLANEOUS) ×1 IMPLANT
DISSECTOR HYDRO NUCLEUS 50X22 (MISCELLANEOUS) ×1 IMPLANT
DRSG TEGADERM 2-3/8X2-3/4 SM (GAUZE/BANDAGES/DRESSINGS) ×1 IMPLANT
FEE CATARACT SUITE SIGHTPATH (MISCELLANEOUS) ×1 IMPLANT
GLOVE SURG SYN 7.5 E (GLOVE) ×1 IMPLANT
GLOVE SURG SYN 7.5 PF PI (GLOVE) ×1 IMPLANT
GLOVE SURG SYN 8.5 E (GLOVE) ×1 IMPLANT
GLOVE SURG SYN 8.5 PF PI (GLOVE) ×1 IMPLANT
LENS IOL TECNIS EYHANCE 20.5 (Intraocular Lens) IMPLANT

## 2022-12-06 NOTE — Op Note (Signed)
OPERATIVE NOTE  Hannah Rangel [id number] 12/06/2022   PREOPERATIVE DIAGNOSIS: Nuclear sclerotic cataract right eye. H25.11   POSTOPERATIVE DIAGNOSIS: Nuclear sclerotic cataract right eye. H25.11   PROCEDURE:  Phacoemusification with posterior chamber intraocular lens placement of the right eye  Ultrasound time: Procedure(s) with comments: CATARACT EXTRACTION PHACO AND INTRAOCULAR LENS PLACEMENT (IOC) RIGHT  5.21 00:41.7 (Right) - Diabetic  LENS:   Implant Name Type Inv. Item Serial No. Manufacturer Lot No. LRB No. Used Action  LENS IOL TECNIS EYHANCE 20.5 - [id number] Intraocular Lens LENS IOL TECNIS EYHANCE 20.5 [id number] [employer]  Right 1 Implanted      SURGEON:  Julious Payer. Rolley Sims, MD   ANESTHESIA:  Topical with tetracaine drops, augmented with 1% preservative-free intracameral lidocaine.   COMPLICATIONS:  None.   DESCRIPTION OF PROCEDURE:  The patient was identified in the holding room and transported to the operating room and placed in the supine position under the operating microscope.  The right eye was identified as the operative eye, which was prepped and draped in the usual sterile ophthalmic fashion.   A 1 millimeter clear-corneal paracentesis was made superotemporally. Preservative-free 1% lidocaine mixed with 1:1,000 bisulfite-free aqueous solution of epinephrine was injected into the anterior chamber. The anterior chamber was then filled with Viscoat viscoelastic. A 2.4 millimeter keratome was used to make a clear-corneal incision inferotemporally. A curvilinear capsulorrhexis was made with a cystotome and capsulorrhexis forceps. Balanced salt solution was used to hydrodissect and hydrodelineate the nucleus. Phacoemulsification was then used to remove the lens nucleus and epinucleus. The remaining cortex was then removed using the irrigation and aspiration handpiece. Provisc was then placed into the capsular bag to distend it for lens placement. A +20.50 D DIB00 intraocular  lens was then injected into the capsular bag. The remaining viscoelastic was aspirated.   Wounds were hydrated with balanced salt solution.  The anterior chamber was inflated to a physiologic pressure with balanced salt solution.  No wound leaks were noted. Vigamox was injected intracamerally.  Timolol and Brimonidine drops were applied to the eye.  The patient was taken to the recovery room in stable condition without complications of anesthesia or surgery.  [name] 12/06/2022, 11:33 AM

## 2022-12-06 NOTE — Anesthesia Postprocedure Evaluation (Signed)
Anesthesia Post Note  Patient: Hannah Rangel  Procedure(s) Performed: CATARACT EXTRACTION PHACO AND INTRAOCULAR LENS PLACEMENT (IOC) RIGHT  5.21 00:41.7 (Right)  Patient location during evaluation: PACU Anesthesia Type: MAC Level of consciousness: awake and alert Pain management: pain level controlled Vital Signs Assessment: post-procedure vital signs reviewed and stable Respiratory status: spontaneous breathing, nonlabored ventilation, respiratory function stable and patient connected to nasal cannula oxygen Cardiovascular status: stable and blood pressure returned to baseline Postop Assessment: no apparent nausea or vomiting Anesthetic complications: no   No notable events documented.   Last Vitals:  Vitals:   12/06/22 1135 12/06/22 1138  BP: 130/70 (!) 153/72  Pulse: 81 81  Resp: 18 20  Temp: 36.8 C   SpO2: 98% 100%    Last Pain:  Vitals:   12/06/22 1138  TempSrc:   PainSc: 0-No pain                 Rosalinda Seaman C Maxi Rodas

## 2022-12-06 NOTE — H&P (Signed)
[facility]   Primary Care Physician:  Patrice Paradise, MD Ophthalmologist: Dr. Deberah Pelton  Pre-Procedure History & Physical: HPI:  Hannah Rangel is a 77 y.o. female here for cataract surgery.   Past Medical History:  Diagnosis Date   Arthritis    Hips, knees   Diabetes mellitus without complication (HCC)    Heart murmur    Hypertension    Type 2 diabetes mellitus with diabetic polyneuropathy [facility])     Past Surgical History:  Procedure Laterality Date   ABDOMINAL HYSTERECTOMY     CATARACT EXTRACTION W/PHACO Left 11/22/2022   Procedure: CATARACT EXTRACTION PHACO AND INTRAOCULAR LENS PLACEMENT (IOC) LEFT VISION BLUE;  Surgeon: Estanislado Pandy, MD;  Location: [facility];  Service: Ophthalmology;  Laterality: Left;  3.88 0:44.0   CHOLECYSTECTOMY     HERNIA REPAIR     Ventral Hernia Repair   MASS EXCISION Left 05/20/2015   Procedure: excision connective tissue tumor left ankle;  Surgeon: Linus Galas, MD;  Location: [facility] ORS;  Service: Podiatry;  Laterality: Left;   THORACOTOMY      Prior to Admission medications   Medication Sig Start Date End Date Taking? Authorizing Provider  amLODipine-benazepril (LOTREL) 10-20 MG capsule Take 1 capsule by mouth daily.    [provider]  aspirin EC 81 MG tablet Take 81 mg by mouth daily.    [provider]  atorvastatin (LIPITOR) 20 MG tablet Take 20 mg by mouth daily.    [provider]  hydrochlorothiazide (HYDRODIURIL) 25 MG tablet Take 25 mg by mouth daily.    [provider]  metFORMIN (GLUCOPHAGE) 1000 MG tablet Take 1,000 mg by mouth 2 (two) times daily with a meal.    [provider]  metoprolol (LOPRESSOR) 50 MG tablet Take 50 mg by mouth daily.     [provider]  potassium chloride SA (KLOR-CON M20) 20 MEQ tablet Take 20 mEq by mouth daily. 10/14/15   [provider]    Allergies as of 09/26/2022   (No Known Allergies)    No family  history on file.  Social History   Socioeconomic History   Marital status: Single    Spouse name: Not on file   Number of children: Not on file   Years of education: Not on file   Highest education level: Not on file  Occupational History   Not on file  Tobacco Use   Smoking status: Never   Smokeless tobacco: Never  Vaping Use   Vaping Use: Never used  Substance and Sexual Activity   Alcohol use: No   Drug use: No   Sexual activity: Not on file  Other Topics Concern   Not on file  Social History Narrative   Not on file   Social Determinants of Health   Financial Resource Strain: Not on file  Food Insecurity: Not on file  Transportation Needs: Not on file  Physical Activity: Not on file  Stress: Not on file  Social Connections: Not on file  Intimate Partner Violence: Not on file    Review of Systems: See HPI, otherwise negative ROS  Physical Exam: There were no vitals taken for this visit. General:   Alert, cooperative in NAD Head:  Normocephalic and atraumatic. Respiratory:  Normal work of breathing. Cardiovascular:  RRR  Impression/Plan: Hannah Rangel is here for cataract surgery.  Risks, benefits, limitations, and alternatives regarding cataract surgery have been reviewed with the patient.  Questions have been answered.  All  parties agreeable.   Estanislado Pandy, MD  12/06/2022, 7:10 AM

## 2022-12-06 NOTE — Transfer of Care (Signed)
Immediate Anesthesia Transfer of Care Note  Patient: Hannah Rangel  Procedure(s) Performed: CATARACT EXTRACTION PHACO AND INTRAOCULAR LENS PLACEMENT (IOC) RIGHT  5.21 00:41.7 (Right)  Patient Location: PACU  Anesthesia Type: MAC  Level of Consciousness: awake, alert  and patient cooperative  Airway and Oxygen Therapy: Patient Spontanous Breathing and Patient connected to supplemental oxygen  Post-op Assessment: Post-op Vital signs reviewed, Patient's Cardiovascular Status Stable, Respiratory Function Stable, Patent Airway and No signs of Nausea or vomiting  Post-op Vital Signs: Reviewed and stable  Complications: No notable events documented.

## 2022-12-07 ENCOUNTER — Encounter: Payer: Self-pay | Admitting: Ophthalmology

## 2022-12-12 DIAGNOSIS — M170 Bilateral primary osteoarthritis of knee: Secondary | ICD-10-CM | POA: Diagnosis not present

## 2022-12-12 DIAGNOSIS — M11261 Other chondrocalcinosis, right knee: Secondary | ICD-10-CM | POA: Diagnosis not present

## 2022-12-12 DIAGNOSIS — M11262 Other chondrocalcinosis, left knee: Secondary | ICD-10-CM | POA: Diagnosis not present

## 2022-12-28 DIAGNOSIS — Z961 Presence of intraocular lens: Secondary | ICD-10-CM | POA: Diagnosis not present

## 2023-03-11 DIAGNOSIS — E1165 Type 2 diabetes mellitus with hyperglycemia: Secondary | ICD-10-CM | POA: Diagnosis not present

## 2023-03-11 DIAGNOSIS — E1169 Type 2 diabetes mellitus with other specified complication: Secondary | ICD-10-CM | POA: Diagnosis not present

## 2023-03-11 DIAGNOSIS — E1142 Type 2 diabetes mellitus with diabetic polyneuropathy: Secondary | ICD-10-CM | POA: Diagnosis not present

## 2023-03-11 DIAGNOSIS — I10 Essential (primary) hypertension: Secondary | ICD-10-CM | POA: Diagnosis not present

## 2023-03-11 DIAGNOSIS — E785 Hyperlipidemia, unspecified: Secondary | ICD-10-CM | POA: Diagnosis not present

## 2023-03-13 DIAGNOSIS — E1165 Type 2 diabetes mellitus with hyperglycemia: Secondary | ICD-10-CM | POA: Diagnosis not present

## 2023-05-27 DIAGNOSIS — E1165 Type 2 diabetes mellitus with hyperglycemia: Secondary | ICD-10-CM | POA: Diagnosis not present

## 2023-05-27 DIAGNOSIS — I10 Essential (primary) hypertension: Secondary | ICD-10-CM | POA: Diagnosis not present

## 2023-05-27 DIAGNOSIS — E782 Mixed hyperlipidemia: Secondary | ICD-10-CM | POA: Diagnosis not present

## 2023-05-30 DIAGNOSIS — M170 Bilateral primary osteoarthritis of knee: Secondary | ICD-10-CM | POA: Diagnosis not present

## 2023-05-30 DIAGNOSIS — I10 Essential (primary) hypertension: Secondary | ICD-10-CM | POA: Diagnosis not present

## 2023-05-30 DIAGNOSIS — Z23 Encounter for immunization: Secondary | ICD-10-CM | POA: Diagnosis not present

## 2023-05-30 DIAGNOSIS — E782 Mixed hyperlipidemia: Secondary | ICD-10-CM | POA: Diagnosis not present

## 2023-05-30 DIAGNOSIS — E119 Type 2 diabetes mellitus without complications: Secondary | ICD-10-CM | POA: Diagnosis not present

## 2023-06-13 DIAGNOSIS — E1142 Type 2 diabetes mellitus with diabetic polyneuropathy: Secondary | ICD-10-CM | POA: Diagnosis not present

## 2023-06-13 DIAGNOSIS — E1169 Type 2 diabetes mellitus with other specified complication: Secondary | ICD-10-CM | POA: Diagnosis not present

## 2023-06-13 DIAGNOSIS — I10 Essential (primary) hypertension: Secondary | ICD-10-CM | POA: Diagnosis not present

## 2023-06-13 DIAGNOSIS — E785 Hyperlipidemia, unspecified: Secondary | ICD-10-CM | POA: Diagnosis not present

## 2023-09-10 DIAGNOSIS — E119 Type 2 diabetes mellitus without complications: Secondary | ICD-10-CM | POA: Diagnosis not present

## 2023-09-10 DIAGNOSIS — H353111 Nonexudative age-related macular degeneration, right eye, early dry stage: Secondary | ICD-10-CM | POA: Diagnosis not present

## 2023-09-10 DIAGNOSIS — H35371 Puckering of macula, right eye: Secondary | ICD-10-CM | POA: Diagnosis not present

## 2023-09-10 DIAGNOSIS — H353121 Nonexudative age-related macular degeneration, left eye, early dry stage: Secondary | ICD-10-CM | POA: Diagnosis not present

## 2023-10-09 DIAGNOSIS — E1142 Type 2 diabetes mellitus with diabetic polyneuropathy: Secondary | ICD-10-CM | POA: Diagnosis not present

## 2023-10-14 DIAGNOSIS — I10 Essential (primary) hypertension: Secondary | ICD-10-CM | POA: Diagnosis not present

## 2023-10-14 DIAGNOSIS — E1142 Type 2 diabetes mellitus with diabetic polyneuropathy: Secondary | ICD-10-CM | POA: Diagnosis not present

## 2023-10-14 DIAGNOSIS — E1169 Type 2 diabetes mellitus with other specified complication: Secondary | ICD-10-CM | POA: Diagnosis not present

## 2023-10-14 DIAGNOSIS — E785 Hyperlipidemia, unspecified: Secondary | ICD-10-CM | POA: Diagnosis not present

## 2023-11-25 DIAGNOSIS — E782 Mixed hyperlipidemia: Secondary | ICD-10-CM | POA: Diagnosis not present

## 2023-11-25 DIAGNOSIS — I10 Essential (primary) hypertension: Secondary | ICD-10-CM | POA: Diagnosis not present

## 2023-12-02 DIAGNOSIS — Z1211 Encounter for screening for malignant neoplasm of colon: Secondary | ICD-10-CM | POA: Diagnosis not present

## 2023-12-02 DIAGNOSIS — M170 Bilateral primary osteoarthritis of knee: Secondary | ICD-10-CM | POA: Diagnosis not present

## 2023-12-02 DIAGNOSIS — E119 Type 2 diabetes mellitus without complications: Secondary | ICD-10-CM | POA: Diagnosis not present

## 2023-12-02 DIAGNOSIS — M1612 Unilateral primary osteoarthritis, left hip: Secondary | ICD-10-CM | POA: Diagnosis not present

## 2023-12-02 DIAGNOSIS — Z0000 Encounter for general adult medical examination without abnormal findings: Secondary | ICD-10-CM | POA: Diagnosis not present

## 2023-12-02 DIAGNOSIS — E782 Mixed hyperlipidemia: Secondary | ICD-10-CM | POA: Diagnosis not present

## 2023-12-02 DIAGNOSIS — I10 Essential (primary) hypertension: Secondary | ICD-10-CM | POA: Diagnosis not present

## 2023-12-02 DIAGNOSIS — Z780 Asymptomatic menopausal state: Secondary | ICD-10-CM | POA: Diagnosis not present

## 2024-02-17 DIAGNOSIS — E1169 Type 2 diabetes mellitus with other specified complication: Secondary | ICD-10-CM | POA: Diagnosis not present

## 2024-02-17 DIAGNOSIS — E1142 Type 2 diabetes mellitus with diabetic polyneuropathy: Secondary | ICD-10-CM | POA: Diagnosis not present

## 2024-02-17 DIAGNOSIS — E785 Hyperlipidemia, unspecified: Secondary | ICD-10-CM | POA: Diagnosis not present

## 2024-05-05 ENCOUNTER — Encounter: Payer: Self-pay | Admitting: *Deleted

## 2024-05-05 NOTE — Progress Notes (Signed)
 [name]                                          MRN: [id number]   05/05/2024   The [facility] Quality Team Specialist reviewed this patient medical record for the purposes of chart review for care gap closure. The following were reviewed: chart review for care gap closure-kidney health evaluation for diabetes:eGFR  and uACR.    [facility] Quality Team
